# Patient Record
Sex: Male | Born: 1985 | Race: Black or African American | Hispanic: No | Marital: Single | Smoking: Current every day smoker
Health system: Southern US, Community
[De-identification: ages and names within clinical notes are randomized; demographics above are authoritative.]

## PROBLEM LIST (undated history)

## (undated) ENCOUNTER — Emergency Department: Admission: EM | Payer: No Typology Code available for payment source | Source: Home / Self Care

## (undated) DIAGNOSIS — J302 Other seasonal allergic rhinitis: Secondary | ICD-10-CM

## (undated) DIAGNOSIS — Z789 Other specified health status: Secondary | ICD-10-CM

---
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
## 2008-12-12 ENCOUNTER — Emergency Department: Payer: Self-pay | Admitting: Emergency Medicine

## 2013-03-08 ENCOUNTER — Emergency Department: Payer: Self-pay | Admitting: Emergency Medicine

## 2013-04-21 ENCOUNTER — Emergency Department: Payer: Self-pay | Admitting: Emergency Medicine

## 2013-11-25 ENCOUNTER — Emergency Department: Payer: Self-pay | Admitting: Emergency Medicine

## 2013-11-25 LAB — BASIC METABOLIC PANEL
ANION GAP: 2 — AB (ref 7–16)
BUN: 13 mg/dL (ref 7–18)
CO2: 30 mmol/L (ref 21–32)
Calcium, Total: 9.1 mg/dL (ref 8.5–10.1)
Chloride: 105 mmol/L (ref 98–107)
Creatinine: 1.06 mg/dL (ref 0.60–1.30)
EGFR (African American): >60
GLUCOSE: 91 mg/dL (ref 65–99)
Osmolality: 274 (ref 275–301)
Potassium: 3.9 mmol/L (ref 3.5–5.1)
Sodium: 137 mmol/L (ref 136–145)

## 2013-11-25 LAB — CBC
HCT: 44.3 % (ref 40.0–52.0)
HGB: 14.1 g/dL (ref 13.0–18.0)
MCH: 27.7 pg (ref 26.0–34.0)
MCHC: 31.9 g/dL — AB (ref 32.0–36.0)
MCV: 87 fL (ref 80–100)
Platelet: 202 10*3/uL (ref 150–440)
RBC: 5.09 10*6/uL (ref 4.40–5.90)
RDW: 16.6 % — AB (ref 11.5–14.5)
WBC: 7.2 10*3/uL (ref 3.8–10.6)

## 2014-12-23 ENCOUNTER — Emergency Department
Admission: EM | Admit: 2014-12-23 | Discharge: 2014-12-23 | Discharge status: Against Medical Advice | Payer: Self-pay | Attending: Emergency Medicine | Admitting: Emergency Medicine

## 2014-12-23 DIAGNOSIS — R112 Nausea with vomiting, unspecified: Secondary | ICD-10-CM | POA: Insufficient documentation

## 2014-12-23 LAB — COMPREHENSIVE METABOLIC PANEL
ALT: 15 U/L — ABNORMAL LOW (ref 17–63)
AST: 24 U/L (ref 15–41)
Albumin: 4 g/dL (ref 3.5–5.0)
Alkaline Phosphatase: 72 U/L (ref 38–126)
Anion gap: 9 (ref 5–15)
BUN: 10 mg/dL (ref 6–20)
CALCIUM: 8.7 mg/dL — AB (ref 8.9–10.3)
CO2: 26 mmol/L (ref 22–32)
CREATININE: 1.05 mg/dL (ref 0.61–1.24)
Chloride: 103 mmol/L (ref 101–111)
GFR calc Af Amer: >60 mL/min (ref >60)
Glucose, Bld: 87 mg/dL (ref 65–99)
Potassium: 3.5 mmol/L (ref 3.5–5.1)
Sodium: 138 mmol/L (ref 135–145)
Total Bilirubin: 0.3 mg/dL (ref 0.3–1.2)
Total Protein: 6.6 g/dL (ref 6.5–8.1)

## 2014-12-23 LAB — CBC WITH DIFFERENTIAL/PLATELET
BASOS PCT: 1 %
Basophils Absolute: 0.1 10*3/uL (ref 0–0.1)
EOS PCT: 1 %
Eosinophils Absolute: 0.1 10*3/uL (ref 0–0.7)
HEMATOCRIT: 40.5 % (ref 40.0–52.0)
HEMOGLOBIN: 13.1 g/dL (ref 13.0–18.0)
Lymphocytes Relative: 40 %
Lymphs Abs: 3.9 10*3/uL — ABNORMAL HIGH (ref 1.0–3.6)
MCH: 28 pg (ref 26.0–34.0)
MCHC: 32.4 g/dL (ref 32.0–36.0)
MCV: 86.5 fL (ref 80.0–100.0)
MONOS PCT: 6 %
Monocytes Absolute: 0.6 10*3/uL (ref 0.2–1.0)
NEUTROS ABS: 5.1 10*3/uL (ref 1.4–6.5)
Neutrophils Relative %: 52 %
Platelets: 189 10*3/uL (ref 150–440)
RBC: 4.68 MIL/uL (ref 4.40–5.90)
RDW: 16 % — ABNORMAL HIGH (ref 11.5–14.5)
WBC: 9.7 10*3/uL (ref 3.8–10.6)

## 2014-12-23 MED ORDER — ONDANSETRON 4 MG PO TBDP
4.0000 mg | ORAL_TABLET | Freq: Once | ORAL | Status: AC
  Administered 2014-12-23: 4 mg via ORAL

## 2014-12-23 MED ORDER — ONDANSETRON 4 MG PO TBDP
ORAL_TABLET | ORAL | Status: AC
  Filled 2014-12-23: qty 1

## 2014-12-23 NOTE — ED Notes (Signed)
Pt reports getting sick last night at work, pt states that he has vomited 8 times with the last time in triage. Pt denies abd pain but states has constant nausea

## 2015-02-14 ENCOUNTER — Emergency Department
Admission: EM | Admit: 2015-02-14 | Discharge: 2015-02-14 | Disposition: A | Discharge status: Home / Self Care | Payer: Self-pay | Attending: Emergency Medicine | Admitting: Emergency Medicine

## 2015-02-14 ENCOUNTER — Encounter: Payer: Self-pay | Admitting: Emergency Medicine

## 2015-02-14 DIAGNOSIS — Z720 Tobacco use: Secondary | ICD-10-CM | POA: Insufficient documentation

## 2015-02-14 DIAGNOSIS — R519 Headache, unspecified: Secondary | ICD-10-CM

## 2015-02-14 DIAGNOSIS — R1110 Vomiting, unspecified: Secondary | ICD-10-CM | POA: Insufficient documentation

## 2015-02-14 DIAGNOSIS — R51 Headache: Secondary | ICD-10-CM | POA: Insufficient documentation

## 2015-02-14 MED ORDER — SODIUM CHLORIDE 0.9 % IV BOLUS (SEPSIS)
1000.0000 mL | Freq: Once | INTRAVENOUS | Status: AC
  Administered 2015-02-14: 1000 mL via INTRAVENOUS

## 2015-02-14 MED ORDER — PROCHLORPERAZINE EDISYLATE 5 MG/ML IJ SOLN
10.0000 mg | Freq: Four times a day (QID) | INTRAMUSCULAR | Status: DC | PRN
  Administered 2015-02-14: 10 mg via INTRAVENOUS
  Filled 2015-02-14: qty 2

## 2015-02-14 NOTE — ED Notes (Signed)
States he developed right side headache last pm  Had some n/v last night  Took OTC meds for pain with out relief

## 2015-02-14 NOTE — Discharge Instructions (Signed)
Please seek medical attention for any high fevers, chest pain, shortness of breath, change in behavior, persistent vomiting, bloody stool or any other new or concerning symptoms. ° °Headaches, Frequently Asked Questions °MIGRAINE HEADACHES °Q: What is migraine? What causes it? How can I treat it? °A: Generally, migraine headaches begin as a dull ache. Then they develop into a constant, throbbing, and pulsating pain. You may experience pain at the temples. You may experience pain at the front or back of one or both sides of the head. The pain is usually accompanied by a combination of: °· Nausea. °· Vomiting. °· Sensitivity to light and noise. °Some people (about 15%) experience an aura (see below) before an attack. The cause of migraine is believed to be chemical reactions in the brain. Treatment for migraine may include over-the-counter or prescription medications. It may also include self-help techniques. These include relaxation training and biofeedback.  °Q: What is an aura? °A: About 15% of people with migraine get an "aura". This is a sign of neurological symptoms that occur before a migraine headache. You may see wavy or jagged lines, dots, or flashing lights. You might experience tunnel vision or blind spots in one or both eyes. The aura can include visual or auditory hallucinations (something imagined). It may include disruptions in smell (such as strange odors), taste or touch. Other symptoms include: °· Numbness. °· A "pins and needles" sensation. °· Difficulty in recalling or speaking the correct word. °These neurological events may last as long as 60 minutes. These symptoms will fade as the headache begins. °Q: What is a trigger? °A: Certain physical or environmental factors can lead to or "trigger" a migraine. These include: °· Foods. °· Hormonal changes. °· Weather. °· Stress. °It is important to remember that triggers are different for everyone. To help prevent migraine attacks, you need to figure  out which triggers affect you. Keep a headache diary. This is a good way to track triggers. The diary will help you talk to your healthcare professional about your condition. °Q: Does weather affect migraines? °A: Bright sunshine, hot, humid conditions, and drastic changes in barometric pressure may lead to, or "trigger," a migraine attack in some people. But studies have shown that weather does not act as a trigger for everyone with migraines. °Q: What is the link between migraine and hormones? °A: Hormones start and regulate many of your body's functions. Hormones keep your body in balance within a constantly changing environment. The levels of hormones in your body are unbalanced at times. Examples are during menstruation, pregnancy, or menopause. That can lead to a migraine attack. In fact, about three quarters of all women with migraine report that their attacks are related to the menstrual cycle.  °Q: Is there an increased risk of stroke for migraine sufferers? °A: The likelihood of a migraine attack causing a stroke is very remote. That is not to say that migraine sufferers cannot have a stroke associated with their migraines. In persons under age 40, the most common associated factor for stroke is migraine headache. But over the course of a person's normal life span, the occurrence of migraine headache may actually be associated with a reduced risk of dying from cerebrovascular disease due to stroke.  °Q: What are acute medications for migraine? °A: Acute medications are used to treat the pain of the headache after it has started. Examples over-the-counter medications, NSAIDs, ergots, and triptans.  °Q: What are the triptans? °A: Triptans are the newest class of abortive medications.   They are specifically targeted to treat migraine. Triptans are vasoconstrictors. They moderate some chemical reactions in the brain. The triptans work on receptors in your brain. Triptans help to restore the balance of a  neurotransmitter called serotonin. Fluctuations in levels of serotonin are thought to be a main cause of migraine.  °Q: Are over-the-counter medications for migraine effective? °A: Over-the-counter, or "OTC," medications may be effective in relieving mild to moderate pain and associated symptoms of migraine. But you should see your caregiver before beginning any treatment regimen for migraine.  °Q: What are preventive medications for migraine? °A: Preventive medications for migraine are sometimes referred to as "prophylactic" treatments. They are used to reduce the frequency, severity, and length of migraine attacks. Examples of preventive medications include antiepileptic medications, antidepressants, beta-blockers, calcium channel blockers, and NSAIDs (nonsteroidal anti-inflammatory drugs). °Q: Why are anticonvulsants used to treat migraine? °A: During the past few years, there has been an increased interest in antiepileptic drugs for the prevention of migraine. They are sometimes referred to as "anticonvulsants". Both epilepsy and migraine may be caused by similar reactions in the brain.  °Q: Why are antidepressants used to treat migraine? °A: Antidepressants are typically used to treat people with depression. They may reduce migraine frequency by regulating chemical levels, such as serotonin, in the brain.  °Q: What alternative therapies are used to treat migraine? °A: The term "alternative therapies" is often used to describe treatments considered outside the scope of conventional Western medicine. Examples of alternative therapy include acupuncture, acupressure, and yoga. Another common alternative treatment is herbal therapy. Some herbs are believed to relieve headache pain. Always discuss alternative therapies with your caregiver before proceeding. Some herbal products contain arsenic and other toxins. °TENSION HEADACHES °Q: What is a tension-type headache? What causes it? How can I treat it? °A:  Tension-type headaches occur randomly. They are often the result of temporary stress, anxiety, fatigue, or anger. Symptoms include soreness in your temples, a tightening band-like sensation around your head (a "vice-like" ache). Symptoms can also include a pulling feeling, pressure sensations, and contracting head and neck muscles. The headache begins in your forehead, temples, or the back of your head and neck. Treatment for tension-type headache may include over-the-counter or prescription medications. Treatment may also include self-help techniques such as relaxation training and biofeedback. °CLUSTER HEADACHES °Q: What is a cluster headache? What causes it? How can I treat it? °A: Cluster headache gets its name because the attacks come in groups. The pain arrives with little, if any, warning. It is usually on one side of the head. A tearing or bloodshot eye and a runny nose on the same side of the headache may also accompany the pain. Cluster headaches are believed to be caused by chemical reactions in the brain. They have been described as the most severe and intense of any headache type. Treatment for cluster headache includes prescription medication and oxygen. °SINUS HEADACHES °Q: What is a sinus headache? What causes it? How can I treat it? °A: When a cavity in the bones of the face and skull (a sinus) becomes inflamed, the inflammation will cause localized pain. This condition is usually the result of an allergic reaction, a tumor, or an infection. If your headache is caused by a sinus blockage, such as an infection, you will probably have a fever. An x-ray will confirm a sinus blockage. Your caregiver's treatment might include antibiotics for the infection, as well as antihistamines or decongestants.  °REBOUND HEADACHES °Q: What is a rebound   headache? What causes it? How can I treat it? °A: A pattern of taking acute headache medications too often can lead to a condition known as "rebound headache." A  pattern of taking too much headache medication includes taking it more than 2 days per week or in excessive amounts. That means more than the label or a caregiver advises. With rebound headaches, your medications not only stop relieving pain, they actually begin to cause headaches. Doctors treat rebound headache by tapering the medication that is being overused. Sometimes your caregiver will gradually substitute a different type of treatment or medication. Stopping may be a challenge. Regularly overusing a medication increases the potential for serious side effects. Consult a caregiver if you regularly use headache medications more than 2 days per week or more than the label advises. °ADDITIONAL QUESTIONS AND ANSWERS °Q: What is biofeedback? °A: Biofeedback is a self-help treatment. Biofeedback uses special equipment to monitor your body's involuntary physical responses. Biofeedback monitors: °· Breathing. °· Pulse. °· Heart rate. °· Temperature. °· Muscle tension. °· Brain activity. °Biofeedback helps you refine and perfect your relaxation exercises. You learn to control the physical responses that are related to stress. Once the technique has been mastered, you do not need the equipment any more. °Q: Are headaches hereditary? °A: Four out of five (80%) of people that suffer report a family history of migraine. Scientists are not sure if this is genetic or a family predisposition. Despite the uncertainty, a child has a 50% chance of having migraine if one parent suffers. The child has a 75% chance if both parents suffer.  °Q: Can children get headaches? °A: By the time they reach high school, most young people have experienced some type of headache. Many safe and effective approaches or medications can prevent a headache from occurring or stop it after it has begun.  °Q: What type of doctor should I see to diagnose and treat my headache? °A: Start with your primary caregiver. Discuss his or her experience and  approach to headaches. Discuss methods of classification, diagnosis, and treatment. Your caregiver may decide to recommend you to a headache specialist, depending upon your symptoms or other physical conditions. Having diabetes, allergies, etc., may require a more comprehensive and inclusive approach to your headache. The [employer] will provide, upon request, a list of NHF physician members in your state. °Document Released: 08/31/2003 Document Revised: 09/02/2011 Document Reviewed: 02/08/2008 °ExitCare® Patient Information ©2015 [employer]. This information is not intended to replace advice given to you by your health care provider. Make sure you discuss any questions you have with your health care provider. ° °

## 2015-02-14 NOTE — ED Provider Notes (Signed)
[facility] Emergency Department Provider Note    ____________________________________________  Time seen: 46  I have reviewed the triage vital signs and the nursing notes.   HISTORY  Chief Complaint Headache   History limited by: Not Limited   HPI Harold Oneill is a 28 y.o. male who presents to the emergency department today because of concerns for headache. He states that it started last night. States it is been constant and severe since then. It is located primarily in the right side. He has noticed some sensitivity to light and sound. He states that he has had headaches in the past but this one was not getting any better. He states he did have one episode of emesis yesterday. Denies any recent trauma to his head. He tried taking BC powder yesterday without any relief. Denies any fevers.   History reviewed. No pertinent past medical history.  There are no active problems to display for this patient.   History reviewed. No pertinent past surgical history.  No current outpatient prescriptions on file.  Allergies Review of patient's allergies indicates no known allergies.  No family history on file.  Social History Social History  Substance Use Topics  . Smoking status: Current Every Day Smoker  . Smokeless tobacco: None  . Alcohol Use: No    Review of Systems  Constitutional: Negative for fever. Cardiovascular: Negative for chest pain. Respiratory: Negative for shortness of breath. Gastrointestinal: Positive for vomiting Genitourinary: Negative for dysuria. Musculoskeletal: Negative for back pain. Skin: Negative for rash. Neurological: Positive for headache   10-point ROS otherwise negative.  ____________________________________________   PHYSICAL EXAM:  VITAL SIGNS: ED Triage Vitals  Enc Vitals Group     BP 02/14/15 1024 119/82 mmHg     Pulse Rate 02/14/15 1024 61     Resp --      Temp 02/14/15 1024 98.2 F (36.8 C)      Temp Source 02/14/15 1024 Oral     SpO2 02/14/15 1024 100 %     Weight 02/14/15 1033 205 lb (92.987 kg)     Height 02/14/15 1033  (1.905 m)     Head Cir --      Peak Flow --      Pain Score 02/14/15 1034 10   Constitutional: Alert and oriented. Well appearing and in no distress. Eyes: Conjunctivae are normal. PERRL. Normal extraocular movements. Photophobia present. No injection.  ENT   Head: Normocephalic and atraumatic.   Nose: No congestion/rhinnorhea.   Mouth/Throat: Mucous membranes are moist.   Neck: No stridor. Hematological/Lymphatic/Immunilogical: No cervical lymphadenopathy. Cardiovascular: Normal rate, regular rhythm.  No murmurs, rubs, or gallops. Respiratory: Normal respiratory effort without tachypnea nor retractions. Breath sounds are clear and equal bilaterally. No wheezes/rales/rhonchi. Gastrointestinal: Soft and nontender. No distention. Genitourinary: Deferred Musculoskeletal: Normal range of motion in all extremities. No joint effusions.  No lower extremity tenderness nor edema. Neurologic:  Normal speech and language. No gross focal neurologic deficits are appreciated. Speech is normal.  Skin:  Skin is warm, dry and intact. No rash noted. Psychiatric: Mood and affect are normal. Speech and behavior are normal. Patient exhibits appropriate insight and judgment.  ____________________________________________    LABS (pertinent positives/negatives)  None  ____________________________________________   EKG  None  ____________________________________________    RADIOLOGY  None  ____________________________________________   PROCEDURES  Procedure(s) performed: None  Critical Care performed: No  ____________________________________________   INITIAL IMPRESSION / ASSESSMENT AND PLAN / ED COURSE  Pertinent labs & imaging  results that were available during my care of the patient were reviewed by me and considered in my  medical decision making (see chart for details).  Patient presented to the emergency department today because of headache since yesterday. On exam no focal neuro deficits. Patient was given Compazine and started on IV fluids. The patient stated he felt better after the IV Compazine and pulled out his IV fluids. He states he felt better and wanted to go home.  ____________________________________________   FINAL CLINICAL IMPRESSION(S) / ED DIAGNOSES  Final diagnoses:  Headache, unspecified headache type     Phineas Semen, MD 02/14/15 1207

## 2015-02-14 NOTE — ED Notes (Signed)
Right sided headaches since last pm had some vomiting last pm

## 2017-02-28 ENCOUNTER — Emergency Department
Admission: EM | Admit: 2017-02-28 | Discharge: 2017-02-28 | Disposition: A | Discharge status: Home / Self Care | Payer: Self-pay | Attending: Emergency Medicine | Admitting: Emergency Medicine

## 2017-02-28 DIAGNOSIS — R030 Elevated blood-pressure reading, without diagnosis of hypertension: Secondary | ICD-10-CM | POA: Insufficient documentation

## 2017-02-28 DIAGNOSIS — Z5321 Procedure and treatment not carried out due to patient leaving prior to being seen by health care provider: Secondary | ICD-10-CM | POA: Insufficient documentation

## 2017-02-28 NOTE — ED Triage Notes (Signed)
Pt states he was at work and was feeling anxious and someone at work told him that it could be his blood pressure.  Pt states he was just here to get that checked, but needs a work note to go back to work since he left.  Pt is A&Ox4 and in NAD at this time.

## 2017-03-01 ENCOUNTER — Emergency Department
Admission: EM | Admit: 2017-03-01 | Discharge: 2017-03-01 | Disposition: A | Discharge status: Home / Self Care | Payer: Self-pay | Attending: Emergency Medicine | Admitting: Emergency Medicine

## 2017-03-01 ENCOUNTER — Emergency Department: Payer: Self-pay

## 2017-03-01 DIAGNOSIS — Y999 Unspecified external cause status: Secondary | ICD-10-CM | POA: Insufficient documentation

## 2017-03-01 DIAGNOSIS — W260XXA Contact with knife, initial encounter: Secondary | ICD-10-CM | POA: Insufficient documentation

## 2017-03-01 DIAGNOSIS — Y92009 Unspecified place in unspecified non-institutional (private) residence as the place of occurrence of the external cause: Secondary | ICD-10-CM | POA: Insufficient documentation

## 2017-03-01 DIAGNOSIS — F17210 Nicotine dependence, cigarettes, uncomplicated: Secondary | ICD-10-CM | POA: Insufficient documentation

## 2017-03-01 DIAGNOSIS — S66822A Laceration of other specified muscles, fascia and tendons at wrist and hand level, left hand, initial encounter: Secondary | ICD-10-CM | POA: Insufficient documentation

## 2017-03-01 DIAGNOSIS — Y93G1 Activity, food preparation and clean up: Secondary | ICD-10-CM | POA: Insufficient documentation

## 2017-03-01 DIAGNOSIS — S66802A Unspecified injury of other specified muscles, fascia and tendons at wrist and hand level, left hand, initial encounter: Secondary | ICD-10-CM | POA: Insufficient documentation

## 2017-03-01 MED ORDER — HYDROCODONE-ACETAMINOPHEN 5-325 MG PO TABS: 1.0000 | ORAL_TABLET | Freq: Four times a day (QID) | ORAL | 0 refills | Status: DC | PRN

## 2017-03-01 MED ORDER — LIDOCAINE HCL (PF) 1 % IJ SOLN
2.0000 mL | Freq: Once | INTRAMUSCULAR | Status: AC
  Administered 2017-03-01: 2 mL
  Filled 2017-03-01: qty 5

## 2017-03-01 MED ORDER — OXYCODONE-ACETAMINOPHEN 5-325 MG PO TABS
1.0000 | ORAL_TABLET | Freq: Once | ORAL | Status: AC
  Administered 2017-03-01: 1 via ORAL
  Filled 2017-03-01: qty 1

## 2017-03-01 MED ORDER — AMOXICILLIN-POT CLAVULANATE 875-125 MG PO TABS: 1.0000 | ORAL_TABLET | Freq: Two times a day (BID) | ORAL | 0 refills | Status: AC

## 2017-03-01 MED ORDER — CEFAZOLIN SODIUM-DEXTROSE 1-4 GM/50ML-% IV SOLN
1.0000 g | Freq: Once | INTRAVENOUS | Status: AC
  Administered 2017-03-01: 1 g via INTRAVENOUS
  Filled 2017-03-01: qty 50

## 2017-03-01 NOTE — ED Triage Notes (Signed)
Pt came to ED via pov. Laceration to left hand, 2,3,4th digits from knife (pt was cooking steak) Pain 10/10.

## 2017-03-01 NOTE — ED Provider Notes (Signed)
[facility]-EMERGENCY DEPARTMENT Provider Note   CSN: [id number] Arrival date & time: 03/01/17  1653     History   Chief Complaint Chief Complaint  Patient presents with  . Laceration    HPI Harold Oneill is a 31 y.o. male presents to the emergency department for evaluation of laceration to the volar aspect of his left second third fourth and fifth digits. patient states just prior to arrival he was at home cutting meat with a steak knife when he sliced his left hand. Patient's pain is moderate. Bleeding is well controlled. Tetanus is up-to-date. He has lacerations to the second third fourth and fifth digits.he feels numbness along the second third fourth and fifth digits. HPI  History reviewed. No pertinent past medical history.  There are no active problems to display for this patient.   History reviewed. No pertinent surgical history.     Home Medications    Prior to Admission medications   Medication Sig Start Date End Date Taking? Authorizing Provider  amoxicillin-clavulanate (AUGMENTIN) 875-125 MG tablet Take 1 tablet by mouth every 12 (twelve) hours. 03/01/17 03/11/17  Evon Slack, PA-C  HYDROcodone-acetaminophen (NORCO) 5-325 MG tablet Take 1 tablet by mouth every 6 (six) hours as needed for moderate pain. 03/01/17   Evon Slack, PA-C    Family History No family history on file.  Social History Social History  Substance Use Topics  . Smoking status: Current Every Day Smoker  . Smokeless tobacco: Not on file  . Alcohol use No     Allergies   Patient has no known allergies.   Review of Systems Review of Systems  Constitutional: Negative for activity change and appetite change.  Respiratory: Negative for shortness of breath.   Cardiovascular: Negative for chest pain.  Gastrointestinal: Negative for nausea.  Musculoskeletal: Negative for arthralgias.  Skin: Negative for color change.  Neurological: Positive for weakness. Negative for dizziness.    Hematological: Negative for adenopathy.  Psychiatric/Behavioral: Negative for agitation and behavioral problems.     Physical Exam Updated Vital Signs BP (!) 160/108   Pulse 82   Temp 97.9 F (36.6 C) (Oral)   Resp 16   Ht  (1.905 m)   Wt 90.7 kg (200 lb)   SpO2 100%   BMI 25.00 kg/m   Physical Exam  Constitutional: He is oriented to person, place, and time. He appears well-developed and well-nourished.  HENT:  Head: Normocephalic and atraumatic.  Eyes: Conjunctivae are normal.  Neck: Normal range of motion.  Cardiovascular: Normal rate.   Pulmonary/Chest: Effort normal. No respiratory distress.  Musculoskeletal: Normal range of motion.  Examination of the left hand shows patient has lacerations along the volar aspect of the middle phalanx of the second third fourth and fifth digits. Patient is unable to flex the DIP joints of the second and third fourth and fifth digits. Sensation is intact distally but slightly decreased on all 4 digits. Cap refill is less than 2 seconds in all 5 digits.  Neurological: He is alert and oriented to person, place, and time.  Skin: Skin is warm. No rash noted.  Psychiatric: He has a normal mood and affect. His behavior is normal. Thought content normal.     ED Treatments / Results  Labs (all labs ordered are listed, but only abnormal results are displayed) Labs Reviewed - No data to display  EKG  EKG Interpretation None       Radiology Dg Hand Complete Left  Result Date: 03/01/2017 CLINICAL  DATA:  Second, third, and fourth digit laceration along the middle phalanx. Laceration from knife. EXAM: LEFT HAND - COMPLETE 3+ VIEW COMPARISON:  None. FINDINGS: Linear soft tissue defect about the volar second, third, and fourth digit middle phalanx consistent with laceration. No radiopaque foreign body. There is no evidence of acute fracture or dislocation. Incidental osseous lunotriquetral coalition. Remote fifth metacarpal fracture. There  is no evidence of arthropathy or other focal bone abnormality. IMPRESSION: Laceration about the second, third, and fourth digit without radiopaque foreign body or acute osseous abnormality. Electronically Signed   By: Rubye Oaks M.D.   On: 03/01/2017 18:35    Procedures Procedures (including critical care time) LACERATION REPAIR Performed by: Patience Musca Authorized by: Patience Musca Consent: Verbal consent obtained. Risks and benefits: risks, benefits and alternatives were discussed Consent given by: patient Patient identity confirmed: provided demographic data Prepped and Draped in normal sterile fashion Wound explored  Laceration Location: volar aspect of the left second third fourth and fifth digits  Laceration Length: 8 cm  No Foreign Bodies seen or palpated  Anesthesia: local infiltration  Local anesthetic: lidocaine 1%  Anesthetic total: 5 ml  Irrigation method: syringe Amount of cleaning: standard  Skin closure: simple interrupted  Number of sutures: 5 on the index finger, 6 on the middle finger, 3 on the ring finger, one on the fifth digit  Technique: simple interrupted Patient tolerance: Patient tolerated the procedure well with no immediate complications.   SPLINT APPLICATION Date/Time: 8:58 PM Authorized by: Patience Musca Consent: Verbal consent obtained. Risks and benefits: risks, benefits and alternatives were discussed Consent given by: patient Splint applied by: ED physician assistant Location details: dorsal aspect of the left hand Splint type: Ortho-Glass, three-inch Supplies used: ace wrap,  Ortho-Glass prewrap Post-procedure: The splinted body part was neurovascularly unchanged following the procedure. Patient tolerance: Patient tolerated the procedure well with no immediate complications.     Medications Ordered in ED Medications  ceFAZolin (ANCEF) IVPB 1 g/50 mL premix (1 g Intravenous New  Bag/Given 03/01/17 1806)  oxyCODONE-acetaminophen (PERCOCET/ROXICET) 5-325 MG per tablet 1 tablet (1 tablet Oral Given 03/01/17 2007)  lidocaine (PF) (XYLOCAINE) 1 % injection 2 mL (2 mLs Infiltration Given 03/01/17 2008)     Initial Impression / Assessment and Plan / ED Course  I have reviewed the triage vital signs and the nursing notes.  Pertinent labs & imaging results that were available during my care of the patient were reviewed by me and considered in my medical decision making (see chart for details).     32 year old male with lacerations to the left second, third, fourth and fifth digits.Physical exam consistent with flexor tendon injuries to the second third and fourth digits. Patient appears to have chronic deformity to the fifth digit. Tetanus is up-to-date. Patient given 1 g of Ancef IV Pain well controlled. Good perfusion throughout the digits. Discussed with orthopedist on call, he recommended hand surgeon. Hand surgeon on call and Ginette Otto was called and agrees to see patient in clinic first of next week. Patient will call the office first thing Monday morning.  Final Clinical Impressions(s) / ED Diagnoses   Final diagnoses:  Injury of flexor tendon of hand, left, initial encounter  Laceration of flexor tendon of left hand, initial encounter    New Prescriptions New Prescriptions   AMOXICILLIN-CLAVULANATE (AUGMENTIN) 875-125 MG TABLET    Take 1 tablet by mouth every 12 (twelve) hours.   HYDROCODONE-ACETAMINOPHEN (NORCO) 5-325 MG TABLET  Take 1 tablet by mouth every 6 (six) hours as needed for moderate pain.     Evon SlackGaines, Kodee Ravert C, PA-C 03/01/17 2058    Jeanmarie PlantMcShane, James A, MD 03/02/17 [phone]

## 2017-03-01 NOTE — ED Notes (Addendum)
Pt smells strong of alcohol. Family at bedside.

## 2017-03-01 NOTE — Discharge Instructions (Signed)
Please keep splint clean and dry. Do not remove splint. Return to the ER for any increasing pain, worsening symptoms or changes in her health. Call Dr. Ronie SpiesWeingold's office first thing Monday morning to schedule follow-up appointment. You will need to have surgery next week.

## 2017-03-01 NOTE — ED Notes (Signed)
Pt stumbled when walking to the room.  Pt also admits to ETOH use today.

## 2017-03-04 ENCOUNTER — Encounter (HOSPITAL_BASED_OUTPATIENT_CLINIC_OR_DEPARTMENT_OTHER): Payer: Self-pay | Admitting: *Deleted

## 2017-03-04 ENCOUNTER — Other Ambulatory Visit: Payer: Self-pay | Admitting: Orthopedic Surgery

## 2017-03-05 ENCOUNTER — Encounter (HOSPITAL_BASED_OUTPATIENT_CLINIC_OR_DEPARTMENT_OTHER)
Admission: RE | Disposition: A | Discharge status: Home / Self Care | Payer: Self-pay | Source: Ambulatory Visit | Attending: Orthopedic Surgery

## 2017-03-05 ENCOUNTER — Ambulatory Visit (HOSPITAL_BASED_OUTPATIENT_CLINIC_OR_DEPARTMENT_OTHER)
Admission: RE | Admit: 2017-03-05 | Discharge: 2017-03-05 | Disposition: A | Discharge status: Home / Self Care | Payer: Self-pay | Source: Ambulatory Visit | Attending: Orthopedic Surgery | Admitting: Orthopedic Surgery

## 2017-03-05 ENCOUNTER — Encounter (HOSPITAL_BASED_OUTPATIENT_CLINIC_OR_DEPARTMENT_OTHER): Payer: Self-pay | Admitting: Emergency Medicine

## 2017-03-05 ENCOUNTER — Ambulatory Visit (HOSPITAL_BASED_OUTPATIENT_CLINIC_OR_DEPARTMENT_OTHER): Payer: Self-pay | Admitting: Certified Registered"

## 2017-03-05 DIAGNOSIS — S64493A Injury of digital nerve of left middle finger, initial encounter: Secondary | ICD-10-CM | POA: Insufficient documentation

## 2017-03-05 DIAGNOSIS — S64491A Injury of digital nerve of left index finger, initial encounter: Secondary | ICD-10-CM | POA: Insufficient documentation

## 2017-03-05 DIAGNOSIS — S61211A Laceration without foreign body of left index finger without damage to nail, initial encounter: Secondary | ICD-10-CM | POA: Insufficient documentation

## 2017-03-05 DIAGNOSIS — S61213A Laceration without foreign body of left middle finger without damage to nail, initial encounter: Secondary | ICD-10-CM | POA: Insufficient documentation

## 2017-03-05 DIAGNOSIS — F17210 Nicotine dependence, cigarettes, uncomplicated: Secondary | ICD-10-CM | POA: Insufficient documentation

## 2017-03-05 DIAGNOSIS — S65012A Laceration of ulnar artery at wrist and hand level of left arm, initial encounter: Secondary | ICD-10-CM | POA: Insufficient documentation

## 2017-03-05 DIAGNOSIS — W458XXA Other foreign body or object entering through skin, initial encounter: Secondary | ICD-10-CM | POA: Insufficient documentation

## 2017-03-05 DIAGNOSIS — S61215A Laceration without foreign body of left ring finger without damage to nail, initial encounter: Secondary | ICD-10-CM | POA: Insufficient documentation

## 2017-03-05 DIAGNOSIS — S64495A Injury of digital nerve of left ring finger, initial encounter: Secondary | ICD-10-CM | POA: Insufficient documentation

## 2017-03-05 HISTORY — DX: Other seasonal allergic rhinitis: J30.2

## 2017-03-05 HISTORY — PX: NERVE, TENDON AND ARTERY REPAIR: SHX5695

## 2017-03-05 SURGERY — NERVE, TENDON AND ARTERY REPAIR
Anesthesia: General | Site: Hand | Laterality: Left

## 2017-03-05 MED ORDER — HYDRALAZINE HCL 20 MG/ML IJ SOLN
INTRAMUSCULAR | Status: AC
  Filled 2017-03-05: qty 1

## 2017-03-05 MED ORDER — FENTANYL CITRATE (PF) 100 MCG/2ML IJ SOLN
INTRAMUSCULAR | Status: AC
  Filled 2017-03-05: qty 2

## 2017-03-05 MED ORDER — PROMETHAZINE HCL 25 MG/ML IJ SOLN: 6.2500 mg | INTRAMUSCULAR | Status: DC | PRN

## 2017-03-05 MED ORDER — MIDAZOLAM HCL 2 MG/2ML IJ SOLN
1.0000 mg | INTRAMUSCULAR | Status: DC | PRN
  Administered 2017-03-05: 2 mg via INTRAVENOUS

## 2017-03-05 MED ORDER — OXYCODONE HCL 5 MG PO TABS: 5.0000 mg | ORAL_TABLET | Freq: Once | ORAL | Status: DC | PRN

## 2017-03-05 MED ORDER — KETOROLAC TROMETHAMINE 30 MG/ML IJ SOLN
INTRAMUSCULAR | Status: DC | PRN
  Administered 2017-03-05: 30 mg via INTRAVENOUS

## 2017-03-05 MED ORDER — CEFAZOLIN SODIUM-DEXTROSE 2-4 GM/100ML-% IV SOLN
2.0000 g | INTRAVENOUS | Status: AC
  Administered 2017-03-05: 2 g via INTRAVENOUS

## 2017-03-05 MED ORDER — OXYCODONE-ACETAMINOPHEN 5-325 MG PO TABS: 1.0000 | ORAL_TABLET | Freq: Four times a day (QID) | ORAL | 0 refills | Status: DC | PRN

## 2017-03-05 MED ORDER — LIDOCAINE 2% (20 MG/ML) 5 ML SYRINGE
INTRAMUSCULAR | Status: DC | PRN
  Administered 2017-03-05: 100 mg via INTRAVENOUS

## 2017-03-05 MED ORDER — HYDROMORPHONE HCL 1 MG/ML IJ SOLN
0.2500 mg | INTRAMUSCULAR | Status: DC | PRN
  Administered 2017-03-05: 0.5 mg via INTRAVENOUS

## 2017-03-05 MED ORDER — DEXAMETHASONE SODIUM PHOSPHATE 10 MG/ML IJ SOLN
INTRAMUSCULAR | Status: DC | PRN
  Administered 2017-03-05: 10 mg via INTRAVENOUS

## 2017-03-05 MED ORDER — BUPIVACAINE HCL (PF) 0.25 % IJ SOLN
INTRAMUSCULAR | Status: DC | PRN
  Administered 2017-03-05: 10 mL

## 2017-03-05 MED ORDER — ONDANSETRON HCL 4 MG/2ML IJ SOLN
INTRAMUSCULAR | Status: DC | PRN
  Administered 2017-03-05: 4 mg via INTRAVENOUS

## 2017-03-05 MED ORDER — PROPOFOL 10 MG/ML IV BOLUS
INTRAVENOUS | Status: DC | PRN
  Administered 2017-03-05: 230 mg via INTRAVENOUS

## 2017-03-05 MED ORDER — MIDAZOLAM HCL 2 MG/2ML IJ SOLN
INTRAMUSCULAR | Status: AC
  Filled 2017-03-05: qty 2

## 2017-03-05 MED ORDER — OXYCODONE HCL 5 MG/5ML PO SOLN: 5.0000 mg | Freq: Once | ORAL | Status: DC | PRN

## 2017-03-05 MED ORDER — FENTANYL CITRATE (PF) 100 MCG/2ML IJ SOLN
50.0000 ug | INTRAMUSCULAR | Status: AC | PRN
  Administered 2017-03-05: 100 ug via INTRAVENOUS
  Administered 2017-03-05: 50 ug via INTRAVENOUS
  Administered 2017-03-05 (×2): 25 ug via INTRAVENOUS

## 2017-03-05 MED ORDER — SCOPOLAMINE 1 MG/3DAYS TD PT72: 1.0000 | MEDICATED_PATCH | Freq: Once | TRANSDERMAL | Status: DC | PRN

## 2017-03-05 MED ORDER — CEFAZOLIN SODIUM-DEXTROSE 2-4 GM/100ML-% IV SOLN
INTRAVENOUS | Status: AC
  Filled 2017-03-05: qty 100

## 2017-03-05 MED ORDER — LACTATED RINGERS IV SOLN
INTRAVENOUS | Status: DC
  Administered 2017-03-05 (×2): via INTRAVENOUS

## 2017-03-05 MED ORDER — CHLORHEXIDINE GLUCONATE 4 % EX LIQD: 60.0000 mL | Freq: Once | CUTANEOUS | Status: DC

## 2017-03-05 MED ORDER — HYDROMORPHONE HCL 1 MG/ML IJ SOLN
INTRAMUSCULAR | Status: AC
  Filled 2017-03-05: qty 0.5

## 2017-03-05 SURGICAL SUPPLY — 68 items
APL SKNCLS STERI-STRIP NONHPOA (GAUZE/BANDAGES/DRESSINGS)
BAG DECANTER FOR FLEXI CONT (MISCELLANEOUS) IMPLANT
BANDAGE ACE 4X5 VEL STRL LF (GAUZE/BANDAGES/DRESSINGS) IMPLANT
BENZOIN TINCTURE PRP APPL 2/3 (GAUZE/BANDAGES/DRESSINGS) IMPLANT
BLADE MINI RND TIP GREEN BEAV (BLADE) IMPLANT
BLADE SURG 15 STRL LF DISP TIS (BLADE) ×1 IMPLANT
BLADE SURG 15 STRL SS (BLADE) ×3
BNDG CMPR 9X4 STRL LF SNTH (GAUZE/BANDAGES/DRESSINGS) ×1
BNDG COHESIVE 1X5 TAN STRL LF (GAUZE/BANDAGES/DRESSINGS) IMPLANT
BNDG ELASTIC 2X5.8 VLCR STR LF (GAUZE/BANDAGES/DRESSINGS) ×3 IMPLANT
BNDG ESMARK 4X9 LF (GAUZE/BANDAGES/DRESSINGS) ×3 IMPLANT
BNDG GAUZE ELAST 4 BULKY (GAUZE/BANDAGES/DRESSINGS) IMPLANT
CLOSURE WOUND 1/2 X4 (GAUZE/BANDAGES/DRESSINGS)
CORD BIPOLAR FORCEPS 12FT (ELECTRODE) ×3 IMPLANT
COVER BACK TABLE 60X90IN (DRAPES) ×3 IMPLANT
CUFF TOURNIQUET SINGLE 18IN (TOURNIQUET CUFF) ×3 IMPLANT
DECANTER SPIKE VIAL GLASS SM (MISCELLANEOUS) IMPLANT
DRAPE EXTREMITY T 121X128X90 (DRAPE) ×3 IMPLANT
DRAPE SURG 17X23 STRL (DRAPES) ×3 IMPLANT
DURAPREP 26ML APPLICATOR (WOUND CARE) ×3 IMPLANT
GAUZE SPONGE 4X4 12PLY STRL (GAUZE/BANDAGES/DRESSINGS) ×3 IMPLANT
GAUZE SPONGE 4X4 16PLY XRAY LF (GAUZE/BANDAGES/DRESSINGS) IMPLANT
GAUZE XEROFORM 1X8 LF (GAUZE/BANDAGES/DRESSINGS) IMPLANT
GLOVE BIO SURGEON STRL SZ 6.5 (GLOVE) ×2 IMPLANT
GLOVE BIO SURGEONS STRL SZ 6.5 (GLOVE) ×1
GLOVE BIOGEL M STRL SZ7.5 (GLOVE) IMPLANT
GLOVE BIOGEL PI IND STRL 7.0 (GLOVE) ×2 IMPLANT
GLOVE BIOGEL PI INDICATOR 7.0 (GLOVE) ×4
GLOVE SURG SYN 8.0 (GLOVE) ×6 IMPLANT
GOWN STRL REUS W/ TWL LRG LVL3 (GOWN DISPOSABLE) ×1 IMPLANT
GOWN STRL REUS W/TWL LRG LVL3 (GOWN DISPOSABLE) ×3
GOWN STRL REUS W/TWL XL LVL3 (GOWN DISPOSABLE) ×3 IMPLANT
IV LACTATED RINGERS 500ML (IV SOLUTION) ×3 IMPLANT
NEEDLE HYPO 25X1 1.5 SAFETY (NEEDLE) ×3 IMPLANT
NS IRRIG 1000ML POUR BTL (IV SOLUTION) ×3 IMPLANT
PACK BASIN DAY SURGERY FS (CUSTOM PROCEDURE TRAY) ×3 IMPLANT
PAD CAST 3X4 CTTN HI CHSV (CAST SUPPLIES) ×1 IMPLANT
PADDING CAST ABS 4INX4YD NS (CAST SUPPLIES) ×2
PADDING CAST ABS COTTON 4X4 ST (CAST SUPPLIES) ×1 IMPLANT
PADDING CAST COTTON 3X4 STRL (CAST SUPPLIES) ×3
PADDING UNDERCAST 2 STRL (CAST SUPPLIES)
PADDING UNDERCAST 2X4 STRL (CAST SUPPLIES) IMPLANT
PASSER SUT SWANSON 36MM LOOP (INSTRUMENTS) IMPLANT
SHEET MEDIUM DRAPE 40X70 STRL (DRAPES) ×3 IMPLANT
SLEEVE SCD COMPRESS KNEE MED (MISCELLANEOUS) IMPLANT
SPEAR EYE SURG WECK-CEL (MISCELLANEOUS) IMPLANT
SPLINT PLASTER CAST XFAST 4X15 (CAST SUPPLIES) ×15 IMPLANT
SPLINT PLASTER XTRA FAST SET 4 (CAST SUPPLIES) ×30
STOCKINETTE 4X48 STRL (DRAPES) ×3 IMPLANT
STRIP CLOSURE SKIN 1/2X4 (GAUZE/BANDAGES/DRESSINGS) IMPLANT
SUT ETHIBOND 3-0 V-5 (SUTURE) IMPLANT
SUT ETHILON 4 0 PS 2 18 (SUTURE) ×3 IMPLANT
SUT ETHILON 5 0 PS 2 18 (SUTURE) IMPLANT
SUT FIBERWIRE 3-0 18 TAPR NDL (SUTURE)
SUT FIBERWIRE 4-0 18 TAPR NDL (SUTURE)
SUT NYLON 9 0 VRM6 (SUTURE) ×3 IMPLANT
SUT PROLENE 3 0 PS 2 (SUTURE) IMPLANT
SUT PROLENE 6 0 P 1 18 (SUTURE) ×6 IMPLANT
SUT PROLENE 6 0 PC 1 (SUTURE) ×6 IMPLANT
SUT VICRYL RAPIDE 4-0 (SUTURE) IMPLANT
SUT VICRYL RAPIDE 4/0 PS 2 (SUTURE) IMPLANT
SUTURE FIBERWR 3-0 18 TAPR NDL (SUTURE) IMPLANT
SUTURE FIBERWR 4-0 18 TAPR NDL (SUTURE) IMPLANT
SYR 10ML LL (SYRINGE) ×3 IMPLANT
SYR BULB 3OZ (MISCELLANEOUS) ×3 IMPLANT
TOWEL OR 17X24 6PK STRL BLUE (TOWEL DISPOSABLE) ×3 IMPLANT
TUBE FEEDING ENTERAL 5FR 16IN (TUBING) IMPLANT
UNDERPAD 30X30 (UNDERPADS AND DIAPERS) ×3 IMPLANT

## 2017-03-05 NOTE — H&P (Signed)
Harold BundeJamal Oneill Harold Oneill is an 31 y.o. male.   Chief Complaint: Left hand digital lacerations with decreased range of motion and numbness HPI: Patient is a 31 year old left-hand-dominant male who was trying to cut frozen steak and accidentally lacerated the palmar aspects of his left index, long, and ring fingers. He was seen at an outside hospital where sutures were placed. He presents today with decreased range of motion numbness in these digits.  Past Medical History:  Diagnosis Date  . Finger laceration involving tendon 03/01/2017   left index, long, ring fingers  . Heartburn    occasional - TUMS as needed  . Seasonal allergies     Past Surgical History:  Procedure Laterality Date  . NO PAST SURGERIES      History reviewed. No pertinent family history. Social History:  reports that he has been smoking Cigarettes.  He has a 4.50 pack-year smoking history. He has never used smokeless tobacco. He reports that he drinks alcohol. He reports that he does not use drugs.  Allergies: No Known Allergies  Medications Prior to Admission  Medication Sig Dispense Refill  . amoxicillin-clavulanate (AUGMENTIN) 875-125 MG tablet Take 1 tablet by mouth every 12 (twelve) hours. 20 tablet 0  . HYDROcodone-acetaminophen (NORCO) 5-325 MG tablet Take 1 tablet by mouth every 6 (six) hours as needed for moderate pain. 15 tablet 0    No results found for this or any previous visit (from the past 48 hour(s)). No results found.  Review of Systems  All other systems reviewed and are negative.   Blood pressure 140/84, pulse 60, temperature 97.8 F (36.6 C), temperature source Oral, resp. rate 16, height 6\' 3"  (1.905 m), weight 85.4 kg (188 lb 3.2 oz), SpO2 100 %. Physical Exam  Constitutional: He is oriented to person, place, and time. He appears well-developed and well-nourished.  HENT:  Head: Normocephalic and atraumatic.  Neck: Normal range of motion.  Cardiovascular: Normal rate.   Respiratory: Effort  normal.  Musculoskeletal:       Left wrist: He exhibits tenderness and laceration.       Left hand: He exhibits decreased range of motion, tenderness and disruption of two-point discrimination.  Left index, long, and ring finger palmar lacerations over middle phalanx with decreased range of motion and sensation  Neurological: He is alert and oriented to person, place, and time.  Skin: Skin is warm.  Psychiatric: He has a normal mood and affect. His behavior is normal. Judgment and thought content normal.     Assessment/Plan As above. Plan on exploration and repairs necessary tendon, artery, and nerve to the left index, long, and ring fingers.  Marlowe ShoresMatthew A Alveria Mcglaughlin, MD 03/05/2017, 1:26 PM

## 2017-03-05 NOTE — Anesthesia Postprocedure Evaluation (Signed)
Anesthesia Post Note  Patient: Harold BundeJamal L [place]  Procedure(s) Performed: Procedure(s) (LRB): NERVE, TENDON AND ARTERY REPAIR INDEX LONG AND RING FINGER (Left)     Patient location during evaluation: PACU Anesthesia Type: General Level of consciousness: awake and alert Pain management: pain level controlled Vital Signs Assessment: post-procedure vital signs reviewed and stable Respiratory status: spontaneous breathing, nonlabored ventilation, respiratory function stable and patient connected to nasal cannula oxygen Cardiovascular status: blood pressure returned to baseline and stable Postop Assessment: no signs of nausea or vomiting Anesthetic complications: no    Last Vitals:  Vitals:   03/05/17 1645 03/05/17 1700  BP: (!) 145/92 (!) 144/82  Pulse: 78 60  Resp: (!) 21 18  Temp:  36.7 C  SpO2: 100% 100%    Last Pain:  Vitals:   03/05/17 1700  TempSrc:   PainSc: 0-No pain                 Ryan P Ellender

## 2017-03-05 NOTE — Discharge Instructions (Signed)

## 2017-03-05 NOTE — Anesthesia Preprocedure Evaluation (Addendum)
Anesthesia Evaluation  Patient identified by MRN, date of birth, ID band Patient awake    Reviewed: Allergy & Precautions, NPO status , Patient's Chart, lab work & pertinent test results  Airway Mallampati: II  TM Distance: >3 FB Neck ROM: Full    Dental no notable dental hx.    Pulmonary Current Smoker,    Pulmonary exam normal breath sounds clear to auscultation       Cardiovascular negative cardio ROS Normal cardiovascular exam Rhythm:Regular Rate:Normal     Neuro/Psych negative neurological ROS  negative psych ROS   GI/Hepatic negative GI ROS, Neg liver ROS,   Endo/Other  negative endocrine ROS  Renal/GU negative Renal ROS     Musculoskeletal negative musculoskeletal ROS (+)   Abdominal   Peds  Hematology negative hematology ROS (+)   Anesthesia Other Findings   Reproductive/Obstetrics                             Anesthesia Physical Anesthesia Plan  ASA: II  Anesthesia Plan: General   Post-op Pain Management:    Induction: Intravenous  PONV Risk Score and Plan: 1 and Ondansetron and Dexamethasone  Airway Management Planned: LMA  Additional Equipment:   Intra-op Plan:   Post-operative Plan: Extubation in OR  Informed Consent: I have reviewed the patients History and Physical, chart, labs and discussed the procedure including the risks, benefits and alternatives for the proposed anesthesia with the patient or authorized representative who has indicated his/her understanding and acceptance.   Dental advisory given  Plan Discussed with: CRNA  Anesthesia Plan Comments: (Potential post op regional anesthesia discussed)       Anesthesia Quick Evaluation

## 2017-03-05 NOTE — Anesthesia Procedure Notes (Signed)
Procedure Name: LMA Insertion Date/Time: 03/05/2017 1:40 PM Performed by: Burna CashONRAD, Camron Monday C Pre-anesthesia Checklist: Patient identified, Emergency Drugs available, Suction available and Patient being monitored Patient Re-evaluated:Patient Re-evaluated prior to induction Oxygen Delivery Method: Circle system utilized Preoxygenation: Pre-oxygenation with 100% oxygen Induction Type: IV induction Ventilation: Mask ventilation without difficulty LMA: LMA inserted LMA Size: 5.0 Number of attempts: 1 Airway Equipment and Method: Bite block Placement Confirmation: positive ETCO2 and breath sounds checked- equal and bilateral Tube secured with: Tape Dental Injury: Teeth and Oropharynx as per pre-operative assessment

## 2017-03-05 NOTE — Transfer of Care (Signed)
Immediate Anesthesia Transfer of Care Note  Patient: Harold Oneill  Procedure(s) Performed: * No procedures listed *  Patient Location: PACU  Anesthesia Type:General  Level of Consciousness: sedated  Airway & Oxygen Therapy: Patient Spontanous Breathing and Patient connected to face mask oxygen  Post-op Assessment: Report given to RN and Post -op Vital signs reviewed and stable  Post vital signs: Reviewed and stable  Last Vitals:  Vitals:   03/05/17 1253  BP: 140/84  Pulse: 60  Resp: 16  Temp: 36.6 C  SpO2: 100%    Last Pain:  Vitals:   03/05/17 1253  TempSrc: Oral  PainSc: 8          Complications: No apparent anesthesia complications

## 2017-03-05 NOTE — Op Note (Signed)
Please see dictated note [phone]

## 2017-03-06 ENCOUNTER — Encounter (HOSPITAL_BASED_OUTPATIENT_CLINIC_OR_DEPARTMENT_OTHER): Payer: Self-pay | Admitting: Orthopedic Surgery

## 2017-03-06 NOTE — Op Note (Signed)
NAME:  [place]                    ACCOUNT NO.:  MEDICAL RECORD NO.:  [id number]  LOCATION:                                 FACILITY:  PHYSICIAN:  Christon Parada A. Ezriel Boffa, M.D.DATE OF BIRTH:  08-16-1985  DATE OF PROCEDURE:  03/05/2017 DATE OF DISCHARGE:                              OPERATIVE REPORT   PREOPERATIVE DIAGNOSIS:  Deep lacerations, palmar aspect, left index, long, and ring fingers.  POSTOPERATIVE DIAGNOSIS:  Deep lacerations, palmar aspect, left index, long, and ring fingers.  PROCEDURES: 1. Exploration of above with primary repair of left index finger     flexor digitorum profundus in zone 2 as well as ulnar digital nerve     repair microscopically. 2. Exploration repair of left long finger flexor digitorum profundus     tendon in zone 2 as well as microscopic repair of ulnar artery and     nerve digitally. 3. Exploration repair of left ring finger flexor digitorum,     superficialis and profundus tendons zone 2 as well as microscopic     repair of ulnar digital artery and nerve.  SURGEON:  Artist PaisMatthew A. Mina MarbleWeingold, M.D.  ASSISTANT:  None.  ANESTHESIA:  General.  COMPLICATION:  No complications.  DRAINS:  No drains.  DESCRIPTION OF PROCEDURE:  The patient was taken to the operating suite after induction of adequate general anesthetic, left upper extremity was prepped and draped in usual sterile fashion.  An Esmarch was used to exsanguinate the limb.  Tourniquet was inflated to 250 mmHg.  At this point in time, lacerations over the middle phalanxes of the index, long and ring finger were extended in gentle midlateral fashion with the proximal limb radial and the distal limb ulnar, flaps were raised. Dissection was carried down to the flexor sheaths.  There was a complete laceration on the index finger, the profundus tendon in zone 2 between the A2 and A4 pulley area, and laceration of the ulnar digital nerve in the long finger.  There was complete profundus  laceration in zone 2, and ulnar artery and nerve and in the ring finger once the superficialis tendon, the profundus tendon and the ulnar artery and nerve.  We repaired the profundus tendons using 3-0 double-armed Ethibond suture and a Tajima-type suture followed by 6-0 running locked Prolene epitendinous stitch.  We repaired the ring finger superficialis slip with a piece of the 3-0 Ethibond.  After the tendon repairs were completed, we then brought the microscope onto the field, and under microscopic magnification, we repaired radial digital artery and nerve of the left long and radial digital artery and nerve of the left ring.  At the end of the procedure, all the wounds were irrigated and then loosely closed with 4-0 nylon.  Xeroform, 4x4s, fluffs, and a dorsal extension block splint was applied.  The patient tolerated this procedure well, went to the recovery room in stable fashion.     Artist PaisMatthew A. Mina MarbleWeingold, M.D.     MAW/MEDQ  D:  03/05/2017  T:  03/06/2017  Job:  [id number]

## 2017-03-22 ENCOUNTER — Emergency Department
Admission: EM | Admit: 2017-03-22 | Discharge: 2017-03-22 | Disposition: A | Discharge status: Home / Self Care | Payer: Self-pay | Attending: Emergency Medicine | Admitting: Emergency Medicine

## 2017-03-22 ENCOUNTER — Encounter: Payer: Self-pay | Admitting: Emergency Medicine

## 2017-03-22 DIAGNOSIS — F17210 Nicotine dependence, cigarettes, uncomplicated: Secondary | ICD-10-CM | POA: Insufficient documentation

## 2017-03-22 DIAGNOSIS — S0181XA Laceration without foreign body of other part of head, initial encounter: Secondary | ICD-10-CM | POA: Insufficient documentation

## 2017-03-22 DIAGNOSIS — W540XXA Bitten by dog, initial encounter: Secondary | ICD-10-CM | POA: Insufficient documentation

## 2017-03-22 DIAGNOSIS — Y999 Unspecified external cause status: Secondary | ICD-10-CM | POA: Insufficient documentation

## 2017-03-22 DIAGNOSIS — Z23 Encounter for immunization: Secondary | ICD-10-CM | POA: Insufficient documentation

## 2017-03-22 DIAGNOSIS — Y929 Unspecified place or not applicable: Secondary | ICD-10-CM | POA: Insufficient documentation

## 2017-03-22 DIAGNOSIS — S0185XA Open bite of other part of head, initial encounter: Secondary | ICD-10-CM

## 2017-03-22 DIAGNOSIS — Y939 Activity, unspecified: Secondary | ICD-10-CM | POA: Insufficient documentation

## 2017-03-22 MED ORDER — HYDROCODONE-ACETAMINOPHEN 5-325 MG PO TABS
1.0000 | ORAL_TABLET | Freq: Once | ORAL | Status: AC
  Administered 2017-03-22: 1 via ORAL
  Filled 2017-03-22: qty 1

## 2017-03-22 MED ORDER — AMOXICILLIN-POT CLAVULANATE 875-125 MG PO TABS
1.0000 | ORAL_TABLET | Freq: Once | ORAL | Status: AC
  Administered 2017-03-22: 1 via ORAL
  Filled 2017-03-22: qty 1

## 2017-03-22 MED ORDER — HYDROCODONE-ACETAMINOPHEN 5-325 MG PO TABS: 1.0000 | ORAL_TABLET | Freq: Three times a day (TID) | ORAL | 0 refills | Status: DC | PRN

## 2017-03-22 MED ORDER — TETANUS-DIPHTH-ACELL PERTUSSIS 5-2.5-18.5 LF-MCG/0.5 IM SUSP
0.5000 mL | Freq: Once | INTRAMUSCULAR | Status: AC
  Administered 2017-03-22: 0.5 mL via INTRAMUSCULAR
  Filled 2017-03-22: qty 0.5

## 2017-03-22 MED ORDER — LIDOCAINE HCL (PF) 1 % IJ SOLN
5.0000 mL | Freq: Once | INTRAMUSCULAR | Status: AC
  Administered 2017-03-22: 5 mL
  Filled 2017-03-22: qty 5

## 2017-03-22 MED ORDER — BACITRACIN ZINC 500 UNIT/GM EX OINT
TOPICAL_OINTMENT | Freq: Once | CUTANEOUS | Status: AC
  Administered 2017-03-22: 1 via TOPICAL
  Filled 2017-03-22: qty 0.9

## 2017-03-22 MED ORDER — AMOXICILLIN-POT CLAVULANATE 875-125 MG PO TABS: 1.0000 | ORAL_TABLET | Freq: Two times a day (BID) | ORAL | 0 refills | Status: DC

## 2017-03-22 NOTE — ED Notes (Signed)
See triage note   Presents with dog bite to face  Bleeding controlled  States it was neighbors dog

## 2017-03-22 NOTE — ED Provider Notes (Signed)
[facility] Emergency Department Provider Note ____________________________________________  Time seen: 1749  I have reviewed the triage vital signs and the nursing notes.  HISTORY  Chief Complaint  Animal Bite  HPI Harold Oneill [place] is a 31 y.o. male presents to the ED for evaluation of a dog bite to the chin. The patient describes he was bitten by a neighbor's dog, that he has see before. Apparently the dog was loose and approached the patient, jumped up, and bit him on bit him in the chin.He denies any other injury at this time. He notes his tetanus is out of date. He notified the officer in the lobby about the dog attack, but has not been contacted by animal control.   Past Medical History:  Diagnosis Date  . Finger laceration involving tendon 03/01/2017   left index, long, ring fingers  . Heartburn    occasional - TUMS as needed  . Seasonal allergies     There are no active problems to display for this patient.   Past Surgical History:  Procedure Laterality Date  . NERVE, TENDON AND ARTERY REPAIR Left 03/05/2017   Procedure: NERVE, TENDON AND ARTERY REPAIR INDEX LONG AND RING FINGER;  Surgeon: Dairl Ponder, MD;  Location: [facility];  Service: Orthopedics;  Laterality: Left;  . NO PAST SURGERIES      Prior to Admission medications   Medication Sig Start Date End Date Taking? Authorizing Provider  amoxicillin-clavulanate (AUGMENTIN) 875-125 MG tablet Take 1 tablet by mouth 2 (two) times daily. 03/22/17   Delron Comer, Charlesetta Ivory, PA-C  HYDROcodone-acetaminophen (NORCO) 5-325 MG tablet Take 1 tablet by mouth 3 (three) times daily as needed. 03/22/17   Aeris Hersman, Charlesetta Ivory, PA-C  oxyCODONE-acetaminophen (ROXICET) 5-325 MG tablet Take 1 tablet by mouth every 6 (six) hours as needed. 03/05/17 03/05/18  Dairl Ponder, MD    Allergies Patient has no known allergies.  History reviewed. No pertinent family history.  Social  History Social History  Substance Use Topics  . Smoking status: Current Every Day Smoker    Packs/day: 0.50    Years: 9.00    Types: Cigarettes  . Smokeless tobacco: Never Used  . Alcohol use Yes     Comment: occasionally    Review of Systems  Constitutional: Negative for fever. Cardiovascular: Negative for chest pain. Respiratory: Negative for shortness of breath. Gastrointestinal: Negative for abdominal pain, vomiting and diarrhea. Musculoskeletal: Negative for back pain. Skin: Negative for rash. Chin lac due to dog bite ____________________________________________  PHYSICAL EXAM:  VITAL SIGNS: ED Triage Vitals  Enc Vitals Group     BP 03/22/17 1711 (!) 168/94     Pulse Rate 03/22/17 1711 (!) 113     Resp 03/22/17 1711 16     Temp 03/22/17 1711 98.1 F (36.7 C)     Temp Source 03/22/17 1711 Oral     SpO2 03/22/17 1711 100 %     Weight 03/22/17 1711 188 lb (85.3 kg)     Height --      Head Circumference --      Peak Flow --      Pain Score 03/22/17 1710 10     Pain Loc --      Pain Edu? --      Excl. in GC? --     Constitutional: Alert and oriented. Well appearing and in no distress. Head: Normocephalic and atraumatic. Patient with large laceration into the subcutaneous tissues of the right chin.  Eyes:  Conjunctivae are normal. Normal extraocular movements Cardiovascular: Normal rate, regular rhythm. Normal distal pulses. Respiratory: Normal respiratory effort. No wheezes/rales/rhonchi. Gastrointestinal: Soft and nontender. No distention. Musculoskeletal: Nontender with normal range of motion in all extremities.  Neurologic:  Normal gait without ataxia. Normal speech and language. No gross focal neurologic deficits are appreciated. Skin:  Skin is warm, dry and intact. No rash noted. ____________________________________________  PROCEDURES  Tdap 0.5 ml IM Augmentin 875 mg PO Norco 5-325 mg PO  LACERATION REPAIR Performed by: Lissa Hoard Authorized by: Lissa Hoard Consent: Verbal consent obtained. Risks and benefits: risks, benefits and alternatives were discussed Consent given by: patient Patient identity confirmed: provided demographic data Prepped and Draped in normal sterile fashion Wound explored  Laceration Location: chin  Laceration Length: 5 cm  No Foreign Bodies seen or palpated  Anesthesia: regional infiltration - right mental foramen  Local anesthetic: lidocaine 1% w/o epinephrine  Anesthetic total: 3 ml  Irrigation method: syringe Amount of cleaning: standard  Skin closure: 4-0 prolene  Number of sutures: 7   Technique: interrupted with long tails  Patient tolerance: Patient tolerated the procedure well with no immediate complications. ____________________________________________  INITIAL IMPRESSION / ASSESSMENT AND PLAN / ED COURSE  Patient with the ED management of an unprovoked dog bite to the face. He was bitten on the chin by a neighbor's dog which he had no prior contact. His tetanus is updated and he is started on Augmentin. Norco #10 is provided for pain relief. He will follow-up with [facility] for further wound care and management. He should follow-up with animal control for rabies status on the dog.  ____________________________________________  FINAL CLINICAL IMPRESSION(S) / ED DIAGNOSES  Final diagnoses:  Dog bite of face, initial encounter  Facial laceration, initial encounter      Lissa Hoard, PA-C 03/22/17 2102    Arnaldo Natal, MD 03/22/17 2231

## 2017-03-22 NOTE — ED Notes (Signed)

## 2017-03-22 NOTE — Discharge Instructions (Addendum)
Keep the wound clean and covered. Wash with soap & water only. Put a small amount of antibiotic ointment on the wound to promote healing. Take the antibiotic as directed and the pain medicine as needed. Take OTC ibuprofen for additional pain relief. Follow-up with the [facility] on Monday, for further instructions.They should attempt to contact the animal's owners, to confirm rabies status.

## 2017-03-22 NOTE — ED Triage Notes (Signed)
Pt to ed with c/o dog bite to right lower face.  Bleeding controlled at this time.

## 2017-08-06 ENCOUNTER — Other Ambulatory Visit: Payer: Self-pay

## 2017-08-06 ENCOUNTER — Emergency Department
Admission: EM | Admit: 2017-08-06 | Discharge: 2017-08-06 | Disposition: A | Discharge status: Home / Self Care | Payer: Self-pay | Attending: Emergency Medicine | Admitting: Emergency Medicine

## 2017-08-06 DIAGNOSIS — W503XXA Accidental bite by another person, initial encounter: Secondary | ICD-10-CM

## 2017-08-06 DIAGNOSIS — Y999 Unspecified external cause status: Secondary | ICD-10-CM | POA: Insufficient documentation

## 2017-08-06 DIAGNOSIS — F17210 Nicotine dependence, cigarettes, uncomplicated: Secondary | ICD-10-CM | POA: Insufficient documentation

## 2017-08-06 DIAGNOSIS — S01551A Open bite of lip, initial encounter: Secondary | ICD-10-CM | POA: Insufficient documentation

## 2017-08-06 DIAGNOSIS — Y939 Activity, unspecified: Secondary | ICD-10-CM | POA: Insufficient documentation

## 2017-08-06 DIAGNOSIS — Y929 Unspecified place or not applicable: Secondary | ICD-10-CM | POA: Insufficient documentation

## 2017-08-06 DIAGNOSIS — S01511A Laceration without foreign body of lip, initial encounter: Secondary | ICD-10-CM

## 2017-08-06 MED ORDER — AMOXICILLIN-POT CLAVULANATE 875-125 MG PO TABS: 1.0000 | ORAL_TABLET | Freq: Two times a day (BID) | ORAL | 0 refills | Status: AC

## 2017-08-06 MED ORDER — AMOXICILLIN-POT CLAVULANATE 875-125 MG PO TABS
1.0000 | ORAL_TABLET | Freq: Once | ORAL | Status: AC
  Administered 2017-08-06: 1 via ORAL
  Filled 2017-08-06: qty 1

## 2017-08-06 MED ORDER — LIDOCAINE HCL (PF) 1 % IJ SOLN
5.0000 mL | Freq: Once | INTRAMUSCULAR | Status: AC
  Administered 2017-08-06: 5 mL
  Filled 2017-08-06: qty 5
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
## 2017-08-06 NOTE — Discharge Instructions (Signed)
Keep the wound clean, dry, and covered. Take the antibiotic as directed. The sutures will dissolve in a week or so.
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
## 2017-08-10 ENCOUNTER — Encounter: Payer: Self-pay | Admitting: Emergency Medicine

## 2017-08-10 ENCOUNTER — Emergency Department
Admission: EM | Admit: 2017-08-10 | Discharge: 2017-08-10 | Disposition: A | Discharge status: Home / Self Care | Payer: Self-pay | Attending: Student in an Organized Health Care Education/Training Program | Admitting: Student in an Organized Health Care Education/Training Program

## 2017-08-10 ENCOUNTER — Other Ambulatory Visit: Payer: Self-pay

## 2017-08-10 DIAGNOSIS — Z5189 Encounter for other specified aftercare: Secondary | ICD-10-CM | POA: Insufficient documentation

## 2017-08-10 DIAGNOSIS — S61412D Laceration without foreign body of left hand, subsequent encounter: Secondary | ICD-10-CM | POA: Insufficient documentation

## 2017-08-10 DIAGNOSIS — W269XXD Contact with unspecified sharp object(s), subsequent encounter: Secondary | ICD-10-CM | POA: Insufficient documentation

## 2017-08-10 DIAGNOSIS — F17210 Nicotine dependence, cigarettes, uncomplicated: Secondary | ICD-10-CM | POA: Insufficient documentation

## 2017-08-10 MED ORDER — SULFAMETHOXAZOLE-TRIMETHOPRIM 800-160 MG PO TABS: 1.0000 | ORAL_TABLET | Freq: Two times a day (BID) | ORAL | 0 refills | Status: AC

## 2017-08-10 MED ORDER — TRAMADOL HCL 50 MG PO TABS: 50.0000 mg | ORAL_TABLET | Freq: Four times a day (QID) | ORAL | 0 refills | Status: AC | PRN

## 2017-08-10 NOTE — ED Notes (Signed)
Patient ambulatory to lobby with steady gait and NAD noted. Verbalized understanding of discharge instructions and follow-up care.  

## 2017-08-10 NOTE — ED Triage Notes (Signed)
Pt in via POV; pt with stiches removed in October from left hand, one stitch remains, reports puss from area.  Area appears clean, dry at this time, swelling noted to middle finger.  Pt reports pain.  NAD noted at this time.

## 2017-08-10 NOTE — ED Provider Notes (Signed)
[facility] Emergency Department Provider Note  ____________________________________________  Time seen: Approximately 3:36 PM  I have reviewed the triage vital signs and the nursing notes.   HISTORY  Chief Complaint Hand Pain    HPI Harold Oneill is a 32 y.o. male presents to the emergency department for a wound check.  Patient sustained flexor tendon lacerations of the second third and fifth left digits on 03/01/2017.  Patient has been under the care of Dr. Mina MarbleWeingold.  Dr. Mina MarbleWeingold performed flexor tendon laceration repair of the left hand and patient has been undergoing physical therapy.  Patient underwent surgery on 04/07/2017.  Patient has a residual suture that became visible today with expression of purulent exudate.  Patient became concerned.  He reports that he has gone to physical therapy "a few times".  He has a baseline of 25 degrees of flexion and aforementioned affected digits.  Patient has a palpable bulge along the proximal aspect of the left middle finger.  No surrounding cellulitis.  No sausage digit.  No alleviating measures have been attempted.   Past Medical History:  Diagnosis Date  . Finger laceration involving tendon 03/01/2017   left index, long, ring fingers  . Heartburn    occasional - TUMS as needed  . Seasonal allergies     There are no active problems to display for this patient.   Past Surgical History:  Procedure Laterality Date  . NERVE, TENDON AND ARTERY REPAIR Left 03/05/2017   Procedure: NERVE, TENDON AND ARTERY REPAIR INDEX LONG AND RING FINGER;  Surgeon: Dairl PonderWeingold, Matthew, MD;  Location: [facility];  Service: Orthopedics;  Laterality: Left;  . NO PAST SURGERIES      Prior to Admission medications   Medication Sig Start Date End Date Taking? Authorizing Provider  amoxicillin-clavulanate (AUGMENTIN) 875-125 MG tablet Take 1 tablet by mouth 2 (two) times daily for 10 days. 08/06/17 08/16/17  Menshew, Charlesetta IvoryJenise V  Bacon, PA-C  HYDROcodone-acetaminophen (NORCO) 5-325 MG tablet Take 1 tablet by mouth 3 (three) times daily as needed. 03/22/17   Menshew, Charlesetta IvoryJenise V Bacon, PA-C  oxyCODONE-acetaminophen (ROXICET) 5-325 MG tablet Take 1 tablet by mouth every 6 (six) hours as needed. 03/05/17 03/05/18  Dairl PonderWeingold, Matthew, MD  sulfamethoxazole-trimethoprim (BACTRIM DS,SEPTRA DS) 800-160 MG tablet Take 1 tablet by mouth 2 (two) times daily for 7 days. 08/10/17 08/17/17  Orvil FeilWoods, Derryck Shahan M, PA-C  traMADol (ULTRAM) 50 MG tablet Take 1 tablet (50 mg total) by mouth every 6 (six) hours as needed for up to 3 days. 08/10/17 08/13/17  Orvil FeilWoods, Quanda Pavlicek M, PA-C    Allergies Patient has no known allergies.  No family history on file.  Social History Social History   Tobacco Use  . Smoking status: Current Every Day Smoker    Packs/day: 0.25    Years: 9.00    Pack years: 2.25    Types: Cigarettes  . Smokeless tobacco: Never Used  Substance Use Topics  . Alcohol use: Yes    Comment: occasionally  . Drug use: No     Review of Systems  Constitutional: No fever/chills Eyes: No visual changes. No discharge ENT: No upper respiratory complaints. Cardiovascular: no chest pain. Respiratory: no cough. No SOB. Musculoskeletal: Patient has left hand pain.  Skin: Negative for rash, abrasions, lacerations, ecchymosis. Neurological: Negative for headaches, focal weakness or numbness.   ____________________________________________   PHYSICAL EXAM:  VITAL SIGNS: ED Triage Vitals  Enc Vitals Group     BP 08/10/17 1443 (!) 151/95  Pulse Rate 08/10/17 1443 93     Resp 08/10/17 1443 16     Temp 08/10/17 1443 98.3 F (36.8 C)     Temp Source 08/10/17 1443 Oral     SpO2 08/10/17 1443 99 %     Weight 08/10/17 1444 185 lb (83.9 kg)     Height 08/10/17 1444 6\' 3"  (1.905 m)     Head Circumference --      Peak Flow --      Pain Score 08/10/17 1450 10     Pain Loc --      Pain Edu? --      Excl. in GC? --       Constitutional: Alert and oriented. Well appearing and in no acute distress. Eyes: Conjunctivae are normal. PERRL. EOMI. Head: Atraumatic. Cardiovascular: Normal rate, regular rhythm. Normal S1 and S2.  Good peripheral circulation. Respiratory: Normal respiratory effort without tachypnea or retractions. Lungs CTAB. Good air entry to the bases with no decreased or absent breath sounds. Musculoskeletal: Patient is able to move all 5 left digits.  Left middle finger has a suture exposed with no not visible.  Suture could not be easily removed despite modest traction.  No expression of purulent exudate occurred.  No surrounding cellulitis.  A palpable bulge was appreciated at the proximal aspect of left middle finger.  Patient reports deformity has been present since time of injury.  Palpable radial pulse, left. Neurologic:  Normal speech and language. No gross focal neurologic deficits are appreciated.  Skin:  Skin is warm, dry and intact. No rash noted. Psychiatric: Mood and affect are normal. Speech and behavior are normal. Patient exhibits appropriate insight and judgement.   ____________________________________________   LABS (all labs ordered are listed, but only abnormal results are displayed)  Labs Reviewed - No data to display ____________________________________________  EKG   ____________________________________________  RADIOLOGY   No results found.  ____________________________________________    PROCEDURES  Procedure(s) performed:    Procedures    Medications - No data to display   ____________________________________________   INITIAL IMPRESSION / ASSESSMENT AND PLAN / ED COURSE  Pertinent labs & imaging results that were available during my care of the patient were reviewed by me and considered in my medical decision making (see chart for details).  Review of the [place] CSRS was performed in accordance of the [facility] prior to dispensing any controlled  drugs.     Assessment and plan Wound check Patient presents to the emergency department with hand pain.  Differential diagnosis included cellulitis and failed flexor tendon repair.  On physical exam, patient had a residual suture at the volar aspect of the left middle finger patient reports has been exuding purulent exudate.  As suture could not be removed with modest traction, patient was referred to orthopedic surgeon, Dr. Mina Marble as I do not want to potentially compromise flexor tendon repair.  The importance of appropriate follow-up with orthopedic surgery was emphasized during 3 instances of this emergency department encounter.  Patient voiced understanding.  Patient was started empirically on Bactrim.  No surrounding cellulitis was visualized on physical exam.  Vital signs are reassuring aside from hypertension.  Patient was discharged with a short course of tramadol after consulting the [place] drug database.  All patient questions were answered.     ____________________________________________  FINAL CLINICAL IMPRESSION(S) / ED DIAGNOSES  Final diagnoses:  Visit for wound check      NEW MEDICATIONS STARTED DURING THIS VISIT:  ED Discharge Orders  Ordered    sulfamethoxazole-trimethoprim (BACTRIM DS,SEPTRA DS) 800-160 MG tablet  2 times daily     08/10/17 1524    traMADol (ULTRAM) 50 MG tablet  Every 6 hours PRN     08/10/17 1526          This chart was dictated using voice recognition software/Dragon. Despite best efforts to proofread, errors can occur which can change the meaning. Any change was purely unintentional.    Orvil Feil, PA-C 08/10/17 1548    Willy Eddy, MD 08/10/17 1655

## 2018-01-05 ENCOUNTER — Other Ambulatory Visit: Payer: Self-pay | Admitting: Orthopedic Surgery

## 2018-01-05 ENCOUNTER — Encounter (HOSPITAL_BASED_OUTPATIENT_CLINIC_OR_DEPARTMENT_OTHER): Payer: Self-pay

## 2018-01-06 ENCOUNTER — Encounter (HOSPITAL_BASED_OUTPATIENT_CLINIC_OR_DEPARTMENT_OTHER): Payer: Self-pay | Admitting: *Deleted

## 2018-01-06 ENCOUNTER — Other Ambulatory Visit: Payer: Self-pay

## 2018-01-06 NOTE — Progress Notes (Signed)
Spoke w/ pt via phone for pre-op interview.  Npo after mn w/ exception clear liquids until 0730 (no cream/milk products).  Arrive at [id number].

## 2018-01-07 ENCOUNTER — Ambulatory Visit (HOSPITAL_BASED_OUTPATIENT_CLINIC_OR_DEPARTMENT_OTHER)
Admission: RE | Admit: 2018-01-07 | Discharge: 2018-01-07 | Disposition: A | Discharge status: Home / Self Care | Payer: Self-pay | Source: Ambulatory Visit | Attending: Orthopedic Surgery | Admitting: Orthopedic Surgery

## 2018-01-07 ENCOUNTER — Encounter (HOSPITAL_BASED_OUTPATIENT_CLINIC_OR_DEPARTMENT_OTHER)
Admission: RE | Disposition: A | Discharge status: Home / Self Care | Payer: Self-pay | Source: Ambulatory Visit | Attending: Orthopedic Surgery

## 2018-01-07 ENCOUNTER — Ambulatory Visit (HOSPITAL_BASED_OUTPATIENT_CLINIC_OR_DEPARTMENT_OTHER): Payer: Self-pay | Admitting: Anesthesiology

## 2018-01-07 ENCOUNTER — Encounter (HOSPITAL_BASED_OUTPATIENT_CLINIC_OR_DEPARTMENT_OTHER): Payer: Self-pay

## 2018-01-07 DIAGNOSIS — T8579XA Infection and inflammatory reaction due to other internal prosthetic devices, implants and grafts, initial encounter: Secondary | ICD-10-CM | POA: Insufficient documentation

## 2018-01-07 DIAGNOSIS — M65142 Other infective (teno)synovitis, left hand: Secondary | ICD-10-CM | POA: Insufficient documentation

## 2018-01-07 DIAGNOSIS — A4902 Methicillin resistant Staphylococcus aureus infection, unspecified site: Secondary | ICD-10-CM | POA: Insufficient documentation

## 2018-01-07 DIAGNOSIS — F17210 Nicotine dependence, cigarettes, uncomplicated: Secondary | ICD-10-CM | POA: Insufficient documentation

## 2018-01-07 DIAGNOSIS — X58XXXA Exposure to other specified factors, initial encounter: Secondary | ICD-10-CM | POA: Insufficient documentation

## 2018-01-07 HISTORY — DX: Other specified health status: Z78.9

## 2018-01-07 HISTORY — PX: SUTURE REMOVAL: SHX6354

## 2018-01-07 SURGERY — REMOVAL, SUTURE
Anesthesia: General | Laterality: Left

## 2018-01-07 MED ORDER — MIDAZOLAM HCL 2 MG/2ML IJ SOLN
INTRAMUSCULAR | Status: DC | PRN
  Administered 2018-01-07: 2 mg via INTRAVENOUS

## 2018-01-07 MED ORDER — HYDROMORPHONE HCL 1 MG/ML IJ SOLN
INTRAMUSCULAR | Status: AC
  Filled 2018-01-07: qty 1

## 2018-01-07 MED ORDER — ONDANSETRON HCL 4 MG/2ML IJ SOLN
4.0000 mg | Freq: Once | INTRAMUSCULAR | Status: DC | PRN
  Filled 2018-01-07: qty 2

## 2018-01-07 MED ORDER — BUPIVACAINE HCL (PF) 0.25 % IJ SOLN
INTRAMUSCULAR | Status: DC | PRN
  Administered 2018-01-07: 3 mL

## 2018-01-07 MED ORDER — LIDOCAINE 2% (20 MG/ML) 5 ML SYRINGE
INTRAMUSCULAR | Status: DC | PRN
  Administered 2018-01-07: 100 mg via INTRAVENOUS

## 2018-01-07 MED ORDER — MIDAZOLAM HCL 2 MG/2ML IJ SOLN
INTRAMUSCULAR | Status: AC
  Filled 2018-01-07: qty 2

## 2018-01-07 MED ORDER — LIDOCAINE 2% (20 MG/ML) 5 ML SYRINGE
INTRAMUSCULAR | Status: AC
  Filled 2018-01-07: qty 5

## 2018-01-07 MED ORDER — HYDROMORPHONE HCL 1 MG/ML IJ SOLN
0.2500 mg | INTRAMUSCULAR | Status: DC | PRN
  Administered 2018-01-07 (×2): 0.5 mg via INTRAVENOUS
  Filled 2018-01-07: qty 0.5

## 2018-01-07 MED ORDER — CHLORHEXIDINE GLUCONATE 4 % EX LIQD
60.0000 mL | Freq: Once | CUTANEOUS | Status: DC
  Filled 2018-01-07: qty 118

## 2018-01-07 MED ORDER — FENTANYL CITRATE (PF) 100 MCG/2ML IJ SOLN
INTRAMUSCULAR | Status: AC
  Filled 2018-01-07: qty 2

## 2018-01-07 MED ORDER — LACTATED RINGERS IV SOLN
INTRAVENOUS | Status: DC
  Administered 2018-01-07: 12:00:00 via INTRAVENOUS
  Filled 2018-01-07: qty 1000

## 2018-01-07 MED ORDER — PROPOFOL 10 MG/ML IV BOLUS
INTRAVENOUS | Status: DC | PRN
  Administered 2018-01-07: 50 mg via INTRAVENOUS
  Administered 2018-01-07: 150 mg via INTRAVENOUS
  Administered 2018-01-07: 50 mg via INTRAVENOUS

## 2018-01-07 MED ORDER — FENTANYL CITRATE (PF) 100 MCG/2ML IJ SOLN
INTRAMUSCULAR | Status: DC | PRN
  Administered 2018-01-07 (×2): 50 ug via INTRAVENOUS

## 2018-01-07 MED ORDER — ONDANSETRON HCL 4 MG/2ML IJ SOLN
INTRAMUSCULAR | Status: DC | PRN
  Administered 2018-01-07: 4 mg via INTRAVENOUS

## 2018-01-07 MED ORDER — ONDANSETRON HCL 4 MG/2ML IJ SOLN
INTRAMUSCULAR | Status: AC
  Filled 2018-01-07: qty 2

## 2018-01-07 MED ORDER — CEFAZOLIN SODIUM-DEXTROSE 2-4 GM/100ML-% IV SOLN
2.0000 g | INTRAVENOUS | Status: AC
  Administered 2018-01-07: 2 g via INTRAVENOUS
  Filled 2018-01-07: qty 100

## 2018-01-07 MED ORDER — MEPERIDINE HCL 25 MG/ML IJ SOLN
6.2500 mg | INTRAMUSCULAR | Status: DC | PRN
  Filled 2018-01-07: qty 1

## 2018-01-07 MED ORDER — CEFAZOLIN SODIUM-DEXTROSE 2-4 GM/100ML-% IV SOLN
INTRAVENOUS | Status: AC
  Filled 2018-01-07: qty 100

## 2018-01-07 MED ORDER — PROPOFOL 10 MG/ML IV BOLUS
INTRAVENOUS | Status: AC
Start: 2018-01-07 — End: ?
  Filled 2018-01-07: qty 20

## 2018-01-07 SURGICAL SUPPLY — 57 items
APL SKNCLS STERI-STRIP NONHPOA (GAUZE/BANDAGES/DRESSINGS)
BANDAGE ACE 3X5.8 VEL STRL LF (GAUZE/BANDAGES/DRESSINGS) IMPLANT
BANDAGE ACE 4X5 VEL STRL LF (GAUZE/BANDAGES/DRESSINGS) IMPLANT
BANDAGE COBAN STERILE 2 (GAUZE/BANDAGES/DRESSINGS) ×3 IMPLANT
BENZOIN TINCTURE PRP APPL 2/3 (GAUZE/BANDAGES/DRESSINGS) IMPLANT
BLADE MINI RND TIP GREEN BEAV (BLADE) IMPLANT
BLADE SURG 15 STRL LF DISP TIS (BLADE) ×1 IMPLANT
BLADE SURG 15 STRL SS (BLADE) ×3
BNDG CMPR 9X4 STRL LF SNTH (GAUZE/BANDAGES/DRESSINGS)
BNDG ELASTIC 2X5.8 VLCR STR LF (GAUZE/BANDAGES/DRESSINGS) IMPLANT
BNDG ESMARK 4X9 LF (GAUZE/BANDAGES/DRESSINGS) IMPLANT
BNDG GAUZE ELAST 4 BULKY (GAUZE/BANDAGES/DRESSINGS) ×3 IMPLANT
CLOSURE WOUND 1/2 X4 (GAUZE/BANDAGES/DRESSINGS)
CORD BIPOLAR FORCEPS 12FT (ELECTRODE) ×3 IMPLANT
COVER BACK TABLE 60X90IN (DRAPES) ×3 IMPLANT
CUFF TOURNIQUET SINGLE 18IN (TOURNIQUET CUFF) ×3 IMPLANT
DECANTER SPIKE VIAL GLASS SM (MISCELLANEOUS) IMPLANT
DRAPE EXTREMITY T 121X128X90 (DRAPE) ×3 IMPLANT
DRAPE LG THREE QUARTER DISP (DRAPES) ×3 IMPLANT
DRAPE SURG 17X23 STRL (DRAPES) ×3 IMPLANT
DURAPREP 26ML APPLICATOR (WOUND CARE) ×3 IMPLANT
GAUZE SPONGE 4X4 12PLY STRL (GAUZE/BANDAGES/DRESSINGS) ×6 IMPLANT
GAUZE XEROFORM 1X8 LF (GAUZE/BANDAGES/DRESSINGS) ×6 IMPLANT
GLOVE BIOGEL M STRL SZ7.5 (GLOVE) ×3 IMPLANT
GLOVE SURG SYN 8.0 (GLOVE) ×6 IMPLANT
GOWN STRL REUS W/ TWL LRG LVL3 (GOWN DISPOSABLE) ×1 IMPLANT
GOWN STRL REUS W/TWL LRG LVL3 (GOWN DISPOSABLE) ×3
GOWN STRL REUS W/TWL XL LVL3 (GOWN DISPOSABLE) ×6 IMPLANT
NDL SAFETY ECLIPSE 18X1.5 (NEEDLE) IMPLANT
NEEDLE HYPO 18GX1.5 SHARP (NEEDLE)
NEEDLE HYPO 25X1 1.5 SAFETY (NEEDLE) IMPLANT
NS IRRIG 1000ML POUR BTL (IV SOLUTION) ×3 IMPLANT
PACK BASIN DAY SURGERY FS (CUSTOM PROCEDURE TRAY) ×3 IMPLANT
PAD CAST 3X4 CTTN HI CHSV (CAST SUPPLIES) ×1 IMPLANT
PADDING CAST ABS 4INX4YD NS (CAST SUPPLIES)
PADDING CAST ABS COTTON 4X4 ST (CAST SUPPLIES) IMPLANT
PADDING CAST COTTON 3X4 STRL (CAST SUPPLIES) ×3
PADDING UNDERCAST 2 STRL (CAST SUPPLIES) ×2
PADDING UNDERCAST 2X4 STRL (CAST SUPPLIES) ×1 IMPLANT
SPLINT PLASTER CAST XFAST 4X15 (CAST SUPPLIES) IMPLANT
SPLINT PLASTER XTRA FAST SET 4 (CAST SUPPLIES)
STOCKINETTE 4X48 STRL (DRAPES) ×3 IMPLANT
STRIP CLOSURE SKIN 1/2X4 (GAUZE/BANDAGES/DRESSINGS) IMPLANT
SUT ETHILON 4 0 PS 2 18 (SUTURE) ×3 IMPLANT
SUT ETHILON 5 0 PS 2 18 (SUTURE) IMPLANT
SUT PROLENE 3 0 PS 2 (SUTURE) IMPLANT
SUT VIC AB 2-0 SH 27 (SUTURE)
SUT VIC AB 2-0 SH 27XBRD (SUTURE) IMPLANT
SUT VICRYL RAPIDE 4-0 (SUTURE) IMPLANT
SUT VICRYL RAPIDE 4/0 PS 2 (SUTURE) IMPLANT
SYR 10ML LL (SYRINGE) ×3 IMPLANT
SYR 5ML LL (SYRINGE) IMPLANT
SYR BULB 3OZ (MISCELLANEOUS) ×3 IMPLANT
SYR CONTROL 10ML LL (SYRINGE) IMPLANT
TOWEL OR 17X24 6PK STRL BLUE (TOWEL DISPOSABLE) ×3 IMPLANT
TUBE FEEDING ENTERAL 5FR 16IN (TUBING) IMPLANT
UNDERPAD 30X30 (UNDERPADS AND DIAPERS) ×3 IMPLANT

## 2018-01-07 NOTE — Anesthesia Procedure Notes (Signed)
Procedure Name: LMA Insertion Date/Time: 01/07/2018 1:17 PM Performed by: Tyrone NineSauve, Boone Gear F, CRNA Pre-anesthesia Checklist: Timeout performed, Patient identified, Emergency Drugs available, Suction available and Patient being monitored Patient Re-evaluated:Patient Re-evaluated prior to induction Oxygen Delivery Method: Circle system utilized Preoxygenation: Pre-oxygenation with 100% oxygen Induction Type: IV induction Ventilation: Mask ventilation without difficulty LMA: LMA inserted LMA Size: 5.0 Number of attempts: 1 Placement Confirmation: positive ETCO2,  CO2 detector and breath sounds checked- equal and bilateral Tube secured with: Tape Dental Injury: Teeth and Oropharynx as per pre-operative assessment

## 2018-01-07 NOTE — Transfer of Care (Signed)
Immediate Anesthesia Transfer of Care Note  Patient: Harold Oneill  Procedure(s) Performed: REMOVAL OF LEFT INDEX, LONG AND RING FINGER RETAINED SUTURE WITH JOINT MANIPULATIONS (Left )  Patient Location: PACU  Anesthesia Type:General  Level of Consciousness: awake, alert , oriented and patient cooperative  Airway & Oxygen Therapy: Patient Spontanous Breathing and Patient connected to nasal cannula oxygen  Post-op Assessment: Report given to RN and Post -op Vital signs reviewed and stable  Post vital signs: Reviewed and stable  Last Vitals:  Vitals Value Taken Time  BP 147/101 01/07/2018  2:05 PM  Temp    Pulse 65 01/07/2018  2:07 PM  Resp 19 01/07/2018  2:07 PM  SpO2 100 % 01/07/2018  2:07 PM  Vitals shown include unvalidated device data.  Last Pain:  Vitals:   01/07/18 1212  TempSrc:   PainSc: 7       Patients Stated Pain Goal: 3 (01/07/18 1212)  Complications: No apparent anesthesia complications

## 2018-01-07 NOTE — Anesthesia Postprocedure Evaluation (Signed)
Anesthesia Post Note  Patient: Harold Oneill [place]  Procedure(s) Performed: REMOVAL OF LEFT INDEX, LONG AND RING FINGER RETAINED SUTURE WITH JOINT MANIPULATIONS (Left )     Patient location during evaluation: PACU Anesthesia Type: General Level of consciousness: awake and alert Pain management: pain level controlled Vital Signs Assessment: post-procedure vital signs reviewed and stable Respiratory status: spontaneous breathing, nonlabored ventilation, respiratory function stable and patient connected to nasal cannula oxygen Cardiovascular status: blood pressure returned to baseline and stable Postop Assessment: no apparent nausea or vomiting Anesthetic complications: no    Last Vitals:  Vitals:   01/07/18 1415 01/07/18 1430  BP: (!) 139/96 (!) 135/101  Pulse: 74 71  Resp: 20 14  Temp:    SpO2: 100% 100%    Last Pain:  Vitals:   01/07/18 1430  TempSrc:   PainSc: 9                  Toshie Demelo DAVID

## 2018-01-07 NOTE — Op Note (Signed)
Please see dictated report [phone]#[id number]

## 2018-01-07 NOTE — Op Note (Signed)
NAME: [place] Harold L. MEDICAL RECORD AO:[id number] ACCOUNT [id number] DATE OF BIRTH:January 18, 1986 FACILITY: [facility] LOCATION: [facility] PHYSICIAN:Vaanya Shambaugh A. Mina MarbleWEINGOLD, MD  OPERATIVE REPORT  DATE OF PROCEDURE:  01/07/2018  PREOPERATIVE DIAGNOSIS:  Status post left index, long, and ring tendon, artery and nerve repair.  POSTOPERATIVE DIAGNOSIS:  Status post left index, long, and ring tendon, artery and nerve repair.  PROCEDURE:  Removal of deep intubated suture from the index, long, and ring finger flexor sheath as well as flexor tenolysis of left index, long, and ring fingers.  SURGEON:  Dairl PonderMatthew Mosella Kasa, MD  ASSISTANT:  None.  ANESTHESIA:  General.  COMPLICATIONS:  None.  DRAINS:  No drains.  CULTURES:  Sent.  SPECIMENS:  None.  DESCRIPTION OF PROCEDURE:  The patient was taken to the operating suite after induction of adequate general anesthetic, left upper extremity was prepped and draped in the usual sterile fashion.  An Esmarch was used to exsanguinate the limb and the  tourniquet was inflated to 250 mmHg.  At this point in time, the left hand was fully supinated on the operating room table and placed in a lead hand.  There was evidence of a reaction to the underlying flexor tendon suture placed previously for his  repairs over the proximal middle phalanx of the index finger, proximal phalanx of the long finger and the middle phalanx of the ring finger.  We incised in an oblique fashion across these raised, erythematous and indurated areas and removed the Ethibond  suture from the profundus repairs as well as the 6-0 Prolene epitendinous type stitches.  Once this was done, we opened the flexor sheath and the cruciate areas preserving pulleys and performed a limited tenolysis of the left index, long, and ring  fingers with gentle manipulations of the distal interphalangeal and proximal interphalangeal joints.  The wounds were thoroughly irrigated.  Cultures were obtained prior to  irrigation and the left index, long and ring fingers were loosely closed with 4-0  nylon.  Xeroform, 4 x 4, and a compressive bandage was applied and the patient tolerated this procedure well and went to recovery in stable fashion.  TN/NUANCE  D:01/07/2018 T:01/07/2018 JOB:[id number]

## 2018-01-07 NOTE — Discharge Instructions (Signed)

## 2018-01-07 NOTE — Anesthesia Preprocedure Evaluation (Addendum)
Anesthesia Evaluation  Patient identified by MRN, date of birth, ID band Patient awake    Reviewed: Allergy & Precautions, NPO status , Patient's Chart, lab work & pertinent test results  Airway Mallampati: I  TM Distance: >3 FB Neck ROM: Full    Dental  (+) Teeth Intact, Dental Advisory Given   Pulmonary Current Smoker,    Pulmonary exam normal        Cardiovascular Exercise Tolerance: Good Normal cardiovascular exam     Neuro/Psych    GI/Hepatic   Endo/Other    Renal/GU      Musculoskeletal   Abdominal   Peds  Hematology   Anesthesia Other Findings   Reproductive/Obstetrics                           Anesthesia Physical Anesthesia Plan  ASA: II  Anesthesia Plan: General   Post-op Pain Management:    Induction: Intravenous  PONV Risk Score and Plan: 1 and Ondansetron  Airway Management Planned: LMA  Additional Equipment:   Intra-op Plan:   Post-operative Plan: Extubation in OR  Informed Consent: I have reviewed the patients History and Physical, chart, labs and discussed the procedure including the risks, benefits and alternatives for the proposed anesthesia with the patient or authorized representative who has indicated his/her understanding and acceptance.     Plan Discussed with: CRNA and Surgeon  Anesthesia Plan Comments:         Anesthesia Quick Evaluation

## 2018-01-07 NOTE — H&P (Signed)
Harold Oneill is an 32 y.o. male.   Chief Complaint: left hand pain and loss of motion HPI: patient's very pleasant [age] year old male right-hand dominant was 1 year status post repair of left index, long, and ring finger tendon artery and nerves. Patient presents with swelling and skin breakdown over FiberWire suture from tendon repairs as well as significant contracture.  Past Medical History:  Diagnosis Date  . History of dog bite    02/ 2019  . Seasonal allergies     Past Surgical History:  Procedure Laterality Date  . NERVE, TENDON AND ARTERY REPAIR Left 03/05/2017   Procedure: NERVE, TENDON AND ARTERY REPAIR INDEX LONG AND RING FINGER;  Surgeon: Dairl PonderWeingold, Peta Peachey, MD;  Location: [facility];  Service: Orthopedics;  Laterality: Left;    History reviewed. No pertinent family history. Social History:  reports that he has been smoking cigarettes.  He has a 5.00 pack-year smoking history. He has never used smokeless tobacco. He reports that he drinks alcohol. He reports that he does not use drugs.  Allergies: No Known Allergies  Medications Prior to Admission  Medication Sig Dispense Refill  . ibuprofen (ADVIL,MOTRIN) 200 MG tablet Take 200 mg by mouth every 6 (six) hours as needed.    . calcium carbonate (TUMS - DOSED IN MG ELEMENTAL CALCIUM) 500 MG chewable tablet Chew 1 tablet by mouth as needed for indigestion or heartburn.      No results found for this or any previous visit (from the past 48 hour(s)). No results found.  Review of Systems  All other systems reviewed and are negative.   Blood pressure (!) 152/89, pulse 72, temperature 98.2 F (36.8 C), temperature source Oral, resp. rate 16, height 6\' 3"  (1.905 m), weight 90.6 kg (199 lb 12.8 oz), SpO2 100 %. Physical Exam  Constitutional: He appears well-developed and well-nourished.  HENT:  Head: Normocephalic and atraumatic.  Neck: Normal range of motion.  Cardiovascular: Normal rate.  Respiratory:  Effort normal.  Musculoskeletal:       Left hand: He exhibits tenderness, deformity and swelling.  Left index, long, and ring flexion contracture with retained painful suture status post flexor tendon repairs in the past  Skin: Skin is warm. There is erythema.  Psychiatric: He has a normal mood and affect. His behavior is normal. Judgment and thought content normal.     Assessment/Plan [age] year old right-hand-dominant male status post left index, long, and ring tendon, artery, and nerve repairs in the past with decreased range of motion and retained painful deep sutures. Have discussed the role of expiration of above suture removal, joint manipulation, and possible Teno lysis as necessary. Patient understands the risks and benefits and wishes to proceed.  Harold ShoresMatthew A Shalom Ware, MD 01/07/2018, 12:54 PM

## 2018-01-08 ENCOUNTER — Encounter (HOSPITAL_BASED_OUTPATIENT_CLINIC_OR_DEPARTMENT_OTHER): Payer: Self-pay | Admitting: Orthopedic Surgery

## 2018-01-14 LAB — AEROBIC/ANAEROBIC CULTURE W GRAM STAIN (SURGICAL/DEEP WOUND)

## 2019-10-07 ENCOUNTER — Emergency Department: Payer: Self-pay

## 2019-10-07 ENCOUNTER — Other Ambulatory Visit: Payer: Self-pay

## 2019-10-07 ENCOUNTER — Emergency Department
Admission: EM | Admit: 2019-10-07 | Discharge: 2019-10-07 | Disposition: A | Discharge status: Home / Self Care | Payer: Self-pay | Attending: Emergency Medicine | Admitting: Emergency Medicine

## 2019-10-07 ENCOUNTER — Encounter: Payer: Self-pay | Admitting: Emergency Medicine

## 2019-10-07 DIAGNOSIS — S60413A Abrasion of left middle finger, initial encounter: Secondary | ICD-10-CM | POA: Insufficient documentation

## 2019-10-07 DIAGNOSIS — Y9339 Activity, other involving climbing, rappelling and jumping off: Secondary | ICD-10-CM | POA: Insufficient documentation

## 2019-10-07 DIAGNOSIS — S20221A Contusion of right back wall of thorax, initial encounter: Secondary | ICD-10-CM | POA: Insufficient documentation

## 2019-10-07 DIAGNOSIS — F17210 Nicotine dependence, cigarettes, uncomplicated: Secondary | ICD-10-CM | POA: Insufficient documentation

## 2019-10-07 DIAGNOSIS — S60512A Abrasion of left hand, initial encounter: Secondary | ICD-10-CM | POA: Insufficient documentation

## 2019-10-07 DIAGNOSIS — Y999 Unspecified external cause status: Secondary | ICD-10-CM | POA: Insufficient documentation

## 2019-10-07 DIAGNOSIS — Y929 Unspecified place or not applicable: Secondary | ICD-10-CM | POA: Insufficient documentation

## 2019-10-07 DIAGNOSIS — W1789XA Other fall from one level to another, initial encounter: Secondary | ICD-10-CM | POA: Insufficient documentation

## 2019-10-07 MED ORDER — CEPHALEXIN 500 MG PO CAPS: 500.0000 mg | ORAL_CAPSULE | Freq: Two times a day (BID) | ORAL | 0 refills | Status: AC

## 2019-10-07 MED ORDER — TRAMADOL HCL 50 MG PO TABS
50.0000 mg | ORAL_TABLET | Freq: Once | ORAL | Status: AC
  Administered 2019-10-07: 50 mg via ORAL
  Filled 2019-10-07: qty 1

## 2019-10-07 MED ORDER — CEPHALEXIN 500 MG PO CAPS
500.0000 mg | ORAL_CAPSULE | Freq: Once | ORAL | Status: AC
  Administered 2019-10-07: 06:00:00 500 mg via ORAL
  Filled 2019-10-07: qty 1

## 2019-10-07 MED ORDER — TRAMADOL HCL 50 MG PO TABS: 50.0000 mg | ORAL_TABLET | Freq: Four times a day (QID) | ORAL | 0 refills | Status: AC | PRN

## 2019-10-07 NOTE — ED Provider Notes (Signed)
[facility] Emergency Department Provider Note  ____________________________________________   First MD Initiated Contact with Patient 10/07/19 0602     (approximate)  I have reviewed the triage vital signs and the nursing notes.   HISTORY  Chief Complaint Fall, Back Pain, and Knee Pain   HPI Harold Oneill is a 34 y.o. male with below list of previous medical conditions presents to the emergency department secondary to accidental fall from a dumpster yesterday onto his back.  Patient denies any head injury no loss of consciousness.  Patient does admit however to upper back pain and laceration to the left middle finger.  Patient denies any fever.  Patient states current pain score is 10 out of 10.  She denies any alleviating factors.  Patient states pain worsened with palpation and movement        Past Medical History:  Diagnosis Date  . History of dog bite    02/ 2019  . Seasonal allergies     There are no problems to display for this patient.   Past Surgical History:  Procedure Laterality Date  . NERVE, TENDON AND ARTERY REPAIR Left 03/05/2017   Procedure: NERVE, TENDON AND ARTERY REPAIR INDEX LONG AND RING FINGER;  Surgeon: Dairl Ponder, MD;  Location: [facility];  Service: Orthopedics;  Laterality: Left;  . SUTURE REMOVAL Left 01/07/2018   Procedure: REMOVAL OF LEFT INDEX, LONG AND RING FINGER RETAINED SUTURE WITH JOINT MANIPULATIONS;  Surgeon: Dairl Ponder, MD;  Location: [facility] [place];  Service: Orthopedics;  Laterality: Left;    Prior to Admission medications   Medication Sig Start Date End Date Taking? Authorizing Provider  calcium carbonate (TUMS - DOSED IN MG ELEMENTAL CALCIUM) 500 MG chewable tablet Chew 1 tablet by mouth as needed for indigestion or heartburn.    [provider]  ibuprofen (ADVIL,MOTRIN) 200 MG tablet Take 200 mg by mouth every 6 (six) hours as needed.    [provider]    Allergies Patient has no known allergies.  No family history on file.  Social History Social History   Tobacco Use  . Smoking status: Current Every Day Smoker    Packs/day: 0.50    Years: 10.00    Pack years: 5.00    Types: Cigarettes  . Smokeless tobacco: Never Used  Substance Use Topics  . Alcohol use: Yes    Comment: occasionally  . Drug use: No    Review of Systems Constitutional: No fever/chills Eyes: No visual changes. ENT: No sore throat. Cardiovascular: Denies chest pain. Respiratory: Denies shortness of breath. Gastrointestinal: No abdominal pain.  No nausea, no vomiting.  No diarrhea.  No constipation. Genitourinary: Negative for dysuria. Musculoskeletal: Negative for neck pain.  Negative for back pain. Integumentary: Laceration to left  middle finger. Neurological: Negative for headaches, focal weakness or numbness.  ____________________________________________   PHYSICAL EXAM:  VITAL SIGNS: ED Triage Vitals  Enc Vitals Group     BP 10/07/19 0031 136/86     Pulse Rate 10/07/19 0031 77     Resp 10/07/19 0031 18     Temp 10/07/19 0031 98.4 F (36.9 C)     Temp Source 10/07/19 0031 Oral     SpO2 10/07/19 0031 97 %     Weight 10/07/19 0031 90.7 kg (200 lb)     Height 10/07/19 0031 1.88 m (6\' 2" )     Head Circumference --      Peak Flow --  Pain Score 10/07/19 0037 10     Pain Loc --      Pain Edu? --      Excl. in GC? --     Constitutional: Alert and oriented.  Eyes: Conjunctivae are normal.  Head: Atraumatic. Mouth/Throat: Patient is wearing a mask. Neck: No stridor.  No meningeal signs.   Cardiovascular: Normal rate, regular rhythm. Good peripheral circulation. Grossly normal heart sounds. Respiratory: Normal respiratory effort.  No retractions. Gastrointestinal: Soft and nontender. No distention.  Musculoskeletal: Abrasion noted left hand middle finger extending from the metacarpal phalangeal joint to the PIP joint.   No active signs of infection at present Neurologic:  Normal speech and language. No gross focal neurologic deficits are appreciated.  Skin:  Skin is warm, dry and intact. Psychiatric: Mood and affect are normal. Speech and behavior are normal.  __________  RADIOLOGY I, Newell N Tahj Njoku, personally viewed and evaluated these images (plain radiographs) as part of my medical decision making, as well as reviewing the written report by the radiologist.  ED MD interpretation: Lumbar spine and left knee and left hand x-ray all negative for acute findings per radiologist  Official radiology report(s): DG Lumbar Spine Complete  Result Date: 10/07/2019 CLINICAL DATA:  Post fall EXAM: LUMBAR SPINE - COMPLETE 4+ VIEW COMPARISON:  None. FINDINGS: Lumbar alignment within normal limits. Vertebral body heights are maintained. Mild disc space narrowing at L5-S1. IMPRESSION: No acute osseous abnormality.  Mild degenerative change at L5-S1 Electronically Signed   By: Jasmine Pang M.D.   On: 10/07/2019 01:14   DG Knee Complete 4 Views Left  Result Date: 10/07/2019 CLINICAL DATA:  Post fall EXAM: LEFT KNEE - COMPLETE 4+ VIEW COMPARISON:  None. FINDINGS: No evidence of fracture, dislocation, or joint effusion. No evidence of arthropathy or other focal bone abnormality. Soft tissues are unremarkable. IMPRESSION: Negative. Electronically Signed   By: Jasmine Pang M.D.   On: 10/07/2019 01:13   DG Hand Complete Left  Result Date: 10/07/2019 CLINICAL DATA:  Post fall EXAM: LEFT HAND - COMPLETE 3+ VIEW COMPARISON:  None. FINDINGS: Limited by flexed positioning of the digits. Old fifth metacarpal fracture. No acute displaced fracture or malalignment. IMPRESSION: No acute osseous abnormality.  Old fifth metacarpal fracture Electronically Signed   By: Jasmine Pang M.D.   On: 10/07/2019 01:14     Procedures   ____________________________________________   INITIAL IMPRESSION / MDM / ASSESSMENT AND PLAN / ED  COURSE  As part of my medical decision making, I reviewed the following data within the electronic MEDICAL RECORD NUMBER   34 year old male presented with above-stated history and physical exam with differential diagnosis including but not limited to compression fracture carpal metacarpal fracture versus contusion.  ____________________________________________  FINAL CLINICAL IMPRESSION(S) / ED DIAGNOSES  Final diagnoses:  Contusion of right side of back, initial encounter  Abrasion of left hand, initial encounter     MEDICATIONS GIVEN DURING THIS VISIT:  Medications  cephALEXin (KEFLEX) capsule 500 mg (has no administration in time range)  traMADol (ULTRAM) tablet 50 mg (has no administration in time range)     ED Discharge Orders    None      *Please note:  Harold Oneill was evaluated in Emergency Department on 10/07/2019 for the symptoms described in the history of present illness. He was evaluated in the context of the global COVID-19 pandemic, which necessitated consideration that the patient might be at risk for infection with the SARS-CoV-2 virus that causes COVID-19. Institutional protocols  and algorithms that pertain to the evaluation of patients at risk for COVID-19 are in a state of rapid change based on information released by regulatory bodies including the CDC and federal and state organizations. These policies and algorithms were followed during the patient's care in the ED.  Some ED evaluations and interventions may be delayed as a result of limited staffing during the pandemic.*  Note:  This document was prepared using Dragon voice recognition software and may include unintentional dictation errors.   Gregor Hams, MD 10/07/19 [phone]

## 2019-10-07 NOTE — ED Triage Notes (Signed)
Pt presents to ED after he fell last night at a restaurant and landed on his back. Now c/o lower back pain. Also c/o left knee pain and left hand pain. Small laceration noted to palm of left hand. ambulatory with steady gait. Denies hitting his head.

## 2019-10-07 NOTE — ED Notes (Signed)
Pt states that he was climbing the side of a dumpster. States he fell off and landed on his back. rerports pain in the back and is tender to palpation on the right upper near the shoulder. Pt has abrasion on the palm and fingers of the left hand.

## 2019-10-16 ENCOUNTER — Other Ambulatory Visit: Payer: Self-pay

## 2019-10-16 ENCOUNTER — Emergency Department: Payer: No Typology Code available for payment source

## 2019-10-16 ENCOUNTER — Emergency Department
Admission: EM | Admit: 2019-10-16 | Discharge: 2019-10-16 | Disposition: A | Discharge status: Home / Self Care | Payer: No Typology Code available for payment source | Attending: Emergency Medicine | Admitting: Emergency Medicine

## 2019-10-16 DIAGNOSIS — M25552 Pain in left hip: Secondary | ICD-10-CM | POA: Diagnosis not present

## 2019-10-16 DIAGNOSIS — F17210 Nicotine dependence, cigarettes, uncomplicated: Secondary | ICD-10-CM | POA: Insufficient documentation

## 2019-10-16 DIAGNOSIS — Z79899 Other long term (current) drug therapy: Secondary | ICD-10-CM | POA: Diagnosis not present

## 2019-10-16 MED ORDER — OXYCODONE-ACETAMINOPHEN 7.5-325 MG PO TABS: 1.0000 | ORAL_TABLET | Freq: Four times a day (QID) | ORAL | 0 refills | Status: AC | PRN

## 2019-10-16 MED ORDER — HYDROMORPHONE HCL 1 MG/ML IJ SOLN
1.0000 mg | Freq: Once | INTRAMUSCULAR | Status: AC
  Administered 2019-10-16: 1 mg via INTRAMUSCULAR
  Filled 2019-10-16: qty 1

## 2019-10-16 MED ORDER — IBUPROFEN 600 MG PO TABS: 600.0000 mg | ORAL_TABLET | Freq: Three times a day (TID) | ORAL | 0 refills | Status: AC | PRN

## 2019-10-16 MED ORDER — CYCLOBENZAPRINE HCL 10 MG PO TABS: 10.0000 mg | ORAL_TABLET | Freq: Three times a day (TID) | ORAL | 0 refills | Status: AC | PRN

## 2019-10-16 MED ORDER — KETOROLAC TROMETHAMINE 30 MG/ML IJ SOLN
60.0000 mg | Freq: Once | INTRAMUSCULAR | Status: AC
  Administered 2019-10-16: 60 mg via INTRAMUSCULAR
  Filled 2019-10-16: qty 2

## 2019-10-16 NOTE — Discharge Instructions (Signed)
Follow discharge care instruction take medication as directed. °

## 2019-10-16 NOTE — ED Notes (Signed)
PD here, pt in Xray
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
## 2019-12-28 ENCOUNTER — Telehealth: Payer: Self-pay | Admitting: General Practice

## 2019-12-28 NOTE — Telephone Encounter (Signed)
Individual has been contacted 3+ times regarding ED referral and has been given information regarding how to become a pt. No further attempts to contact individual will be made. 

## 2021-01-30 ENCOUNTER — Emergency Department
Admission: EM | Admit: 2021-01-30 | Discharge: 2021-01-30 | Disposition: A | Discharge status: Home / Self Care | Payer: Self-pay | Attending: Emergency Medicine | Admitting: Emergency Medicine

## 2021-01-30 ENCOUNTER — Other Ambulatory Visit: Payer: Self-pay

## 2021-01-30 ENCOUNTER — Encounter: Payer: Self-pay | Admitting: Emergency Medicine

## 2021-01-30 DIAGNOSIS — U071 COVID-19: Secondary | ICD-10-CM | POA: Insufficient documentation

## 2021-01-30 DIAGNOSIS — B349 Viral infection, unspecified: Secondary | ICD-10-CM | POA: Insufficient documentation

## 2021-01-30 LAB — RESP PANEL BY RT-PCR (FLU A&B, COVID) ARPGX2
Influenza A by PCR: NEGATIVE
Influenza B by PCR: NEGATIVE
SARS Coronavirus 2 by RT PCR: POSITIVE — AB

## 2021-01-30 MED ORDER — DICYCLOMINE HCL 10 MG PO CAPS: 10.0000 mg | ORAL_CAPSULE | Freq: Three times a day (TID) | ORAL | 0 refills | Status: AC

## 2021-01-30 MED ORDER — ALBUTEROL SULFATE HFA 108 (90 BASE) MCG/ACT IN AERS: 2.0000 | INHALATION_SPRAY | Freq: Four times a day (QID) | RESPIRATORY_TRACT | 2 refills | Status: AC | PRN

## 2021-01-30 MED ORDER — ONDANSETRON 4 MG PO TBDP: 4.0000 mg | ORAL_TABLET | Freq: Three times a day (TID) | ORAL | 0 refills | Status: AC | PRN

## 2021-01-30 NOTE — ED Provider Notes (Signed)
[facility]-EMERGENCY DEPARTMENT  ____________________________________________  Time seen: Approximately 4:11 PM  I have reviewed the triage vital signs and the nursing notes.   HISTORY  Chief Complaint Generalized Body Aches   Historian Patient    HPI Harold Oneill [place] is a 35 y.o. male presents to the ED with headache, bodyaches and chills since he finished work yesterday. States that he potentially has sick contacts at work. He denies chest pain, chest tightness, shortness of breath or cough at home No nasal congestion or rhinorrhea but has noticed some bilateral conjunctivitis. He has been able to tolerate PO without emesis or diarrhea at home.    Past Medical History:  Diagnosis Date   History of dog bite    02/ 2019   Seasonal allergies      Immunizations up to date:  Yes.     Past Medical History:  Diagnosis Date   History of dog bite    02/ 2019   Seasonal allergies     There are no problems to display for this patient.   Past Surgical History:  Procedure Laterality Date   NERVE, TENDON AND ARTERY REPAIR Left 03/05/2017   Procedure: NERVE, TENDON AND ARTERY REPAIR INDEX LONG AND RING FINGER;  Surgeon: Dairl Ponder, MD;  Location: [facility];  Service: Orthopedics;  Laterality: Left;   SUTURE REMOVAL Left 01/07/2018   Procedure: REMOVAL OF LEFT INDEX, LONG AND RING FINGER RETAINED SUTURE WITH JOINT MANIPULATIONS;  Surgeon: Dairl Ponder, MD;  Location: [facility];  Service: Orthopedics;  Laterality: Left;    Prior to Admission medications   Medication Sig Start Date End Date Taking? Authorizing Provider  albuterol (VENTOLIN HFA) 108 (90 Base) MCG/ACT inhaler Inhale 2 puffs into the lungs every 6 (six) hours as needed for wheezing or shortness of breath. 01/30/21  Yes Pia Mau M, PA-C  dicyclomine (BENTYL) 10 MG capsule Take 1 capsule (10 mg total) by mouth 4 (four) times daily -  before meals and at bedtime for 7 days.  01/30/21 02/06/21 Yes Pia Mau M, PA-C  ondansetron (ZOFRAN ODT) 4 MG disintegrating tablet Take 1 tablet (4 mg total) by mouth every 8 (eight) hours as needed for up to 5 days. 01/30/21 02/04/21 Yes Pia Mau M, PA-C  calcium carbonate (TUMS - DOSED IN MG ELEMENTAL CALCIUM) 500 MG chewable tablet Chew 1 tablet by mouth as needed for indigestion or heartburn.    [provider]  cyclobenzaprine (FLEXERIL) 10 MG tablet Take 1 tablet (10 mg total) by mouth 3 (three) times daily as needed. 10/16/19   Joni Reining, PA-C  ibuprofen (ADVIL) 600 MG tablet Take 1 tablet (600 mg total) by mouth every 8 (eight) hours as needed. 10/16/19   Joni Reining, PA-C  ibuprofen (ADVIL,MOTRIN) 200 MG tablet Take 200 mg by mouth every 6 (six) hours as needed.    [provider]  oxyCODONE-acetaminophen (PERCOCET) 7.5-325 MG tablet Take 1 tablet by mouth every 6 (six) hours as needed for severe pain. 10/16/19   Joni Reining, PA-C    Allergies Patient has no known allergies.  No family history on file.  Social History Social History   Tobacco Use   Smoking status: Every Day    Packs/day: 0.25    Years: 10.00    Pack years: 2.50    Types: Cigarettes   Smokeless tobacco: Never  Vaping Use   Vaping Use: Never used  Substance Use Topics   Alcohol use: Yes  Comment: occasionally   Drug use: No     Review of Systems  Constitutional: No fever/chills Eyes:  No discharge ENT: No upper respiratory complaints. Respiratory: no cough. No SOB/ use of accessory muscles to breath Gastrointestinal:   No nausea, no vomiting.  No diarrhea.  No constipation. Musculoskeletal: Negative for musculoskeletal pain. Neuro: Patient has headache and myalgias.  Skin: Negative for rash, abrasions, lacerations, ecchymosis.    ____________________________________________   PHYSICAL EXAM:  VITAL SIGNS: ED Triage Vitals  Enc Vitals Group     BP 01/30/21 1553 (!) 157/90     Pulse Rate 01/30/21  1553 79     Resp 01/30/21 1553 20     Temp 01/30/21 1553 98.7 F (37.1 C)     Temp Source 01/30/21 1553 Oral     SpO2 01/30/21 1553 98 %     Weight 01/30/21 1542 195 lb 1.7 oz (88.5 kg)     Height 01/30/21 1542 6\' 3"  (1.905 m)     Head Circumference --      Peak Flow --      Pain Score 01/30/21 1541 0     Pain Loc --      Pain Edu? --      Excl. in GC? --     Constitutional: Alert and oriented. Patient is lying supine. Eyes: Conjunctivae are normal. PERRL. EOMI. Head: Atraumatic. ENT:      Ears: Tympanic membranes are mildly injected with mild effusion bilaterally.       Nose: No congestion/rhinnorhea.      Mouth/Throat: Mucous membranes are moist. Posterior pharynx is mildly erythematous.  Hematological/Lymphatic/Immunilogical: No cervical lymphadenopathy.  Cardiovascular: Normal rate, regular rhythm. Normal S1 and S2.  Good peripheral circulation. Respiratory: Normal respiratory effort without tachypnea or retractions. Lungs CTAB. Good air entry to the bases with no decreased or absent breath sounds. Gastrointestinal: Bowel sounds 4 quadrants. Soft and nontender to palpation. No guarding or rigidity. No palpable masses. No distention. No CVA tenderness. Musculoskeletal: Full range of motion to all extremities. No gross deformities appreciated. Neurologic:  Normal speech and language. No gross focal neurologic deficits are appreciated.  Skin:  Skin is warm, dry and intact. No rash noted. Psychiatric: Mood and affect are normal. Speech and behavior are normal. Patient exhibits appropriate insight and judgement.   ____________________________________________   LABS (all labs ordered are listed, but only abnormal results are displayed)  Labs Reviewed  RESP PANEL BY RT-PCR (FLU A&B, COVID) [id number]   ____________________________________________  EKG   ____________________________________________  RADIOLOGY   No results  found.  ____________________________________________    PROCEDURES  Procedure(s) performed:     Procedures     Medications - No data to display   ____________________________________________   INITIAL IMPRESSION / ASSESSMENT AND PLAN / ED COURSE  Pertinent labs & imaging results that were available during my care of the patient were reviewed by me and considered in my medical decision making (see chart for details).    Assessment and Plan: Viral URI 35 y/o male presents to the ED with headache, myalgias and conjunctivitis for the past 24 hours.  Patient was hypertensive at triage but vital signs were otherwise reassuring.  Will obtain Covid 19 test and send patient home with medications to help with viral symptoms.   Return precautions were given to return with new or worsening symptoms. A work note was provided in patient's discharge paperwork. All patient questions were answered.       ____________________________________________  FINAL CLINICAL IMPRESSION(S) /  ED DIAGNOSES  Final diagnoses:  Viral illness      NEW MEDICATIONS STARTED DURING THIS VISIT:  ED Discharge Orders          Ordered    albuterol (VENTOLIN HFA) 108 (90 Base) MCG/ACT inhaler  Every 6 hours PRN        01/30/21 1606    ondansetron (ZOFRAN ODT) 4 MG disintegrating tablet  Every 8 hours PRN        01/30/21 1606    dicyclomine (BENTYL) 10 MG capsule  3 times daily before meals & bedtime        01/30/21 1606                This chart was dictated using voice recognition software/Dragon. Despite best efforts to proofread, errors can occur which can change the meaning. Any change was purely unintentional.     Orvil Feil, PA-C 01/30/21 1615    Sharman Cheek, MD 01/30/21 1815

## 2021-01-30 NOTE — Discharge Instructions (Addendum)
You can take 2 puffs of Albuterol as needed for wheezing. You can take one 4 mg tablet of Zofran every four hours as needed for nausea.  You can take one 10 mg tablet of Bentyl as needed for abdominal spasms.  You can check your Covid 19 testing results on MyChart. You can take 650 mg of Tylenol and 600 mg of Ibuprofen every six hours together or alternating.

## 2021-01-30 NOTE — ED Triage Notes (Signed)
C/O chills and bodyaches today.  AAOx3.  Skin warm and dry. NAD

## 2021-02-15 IMAGING — CR DG HAND COMPLETE 3+V*L*
1 series · 3 of 3 positions shown · non-contrast
Comparison: None.

CLINICAL DATA: Post fall

EXAM:
LEFT HAND - COMPLETE 3+ VIEW

[Series 1: dg hand complete left · 0.14mm/px · 3 of 3 slices shown]
[im 1/3]
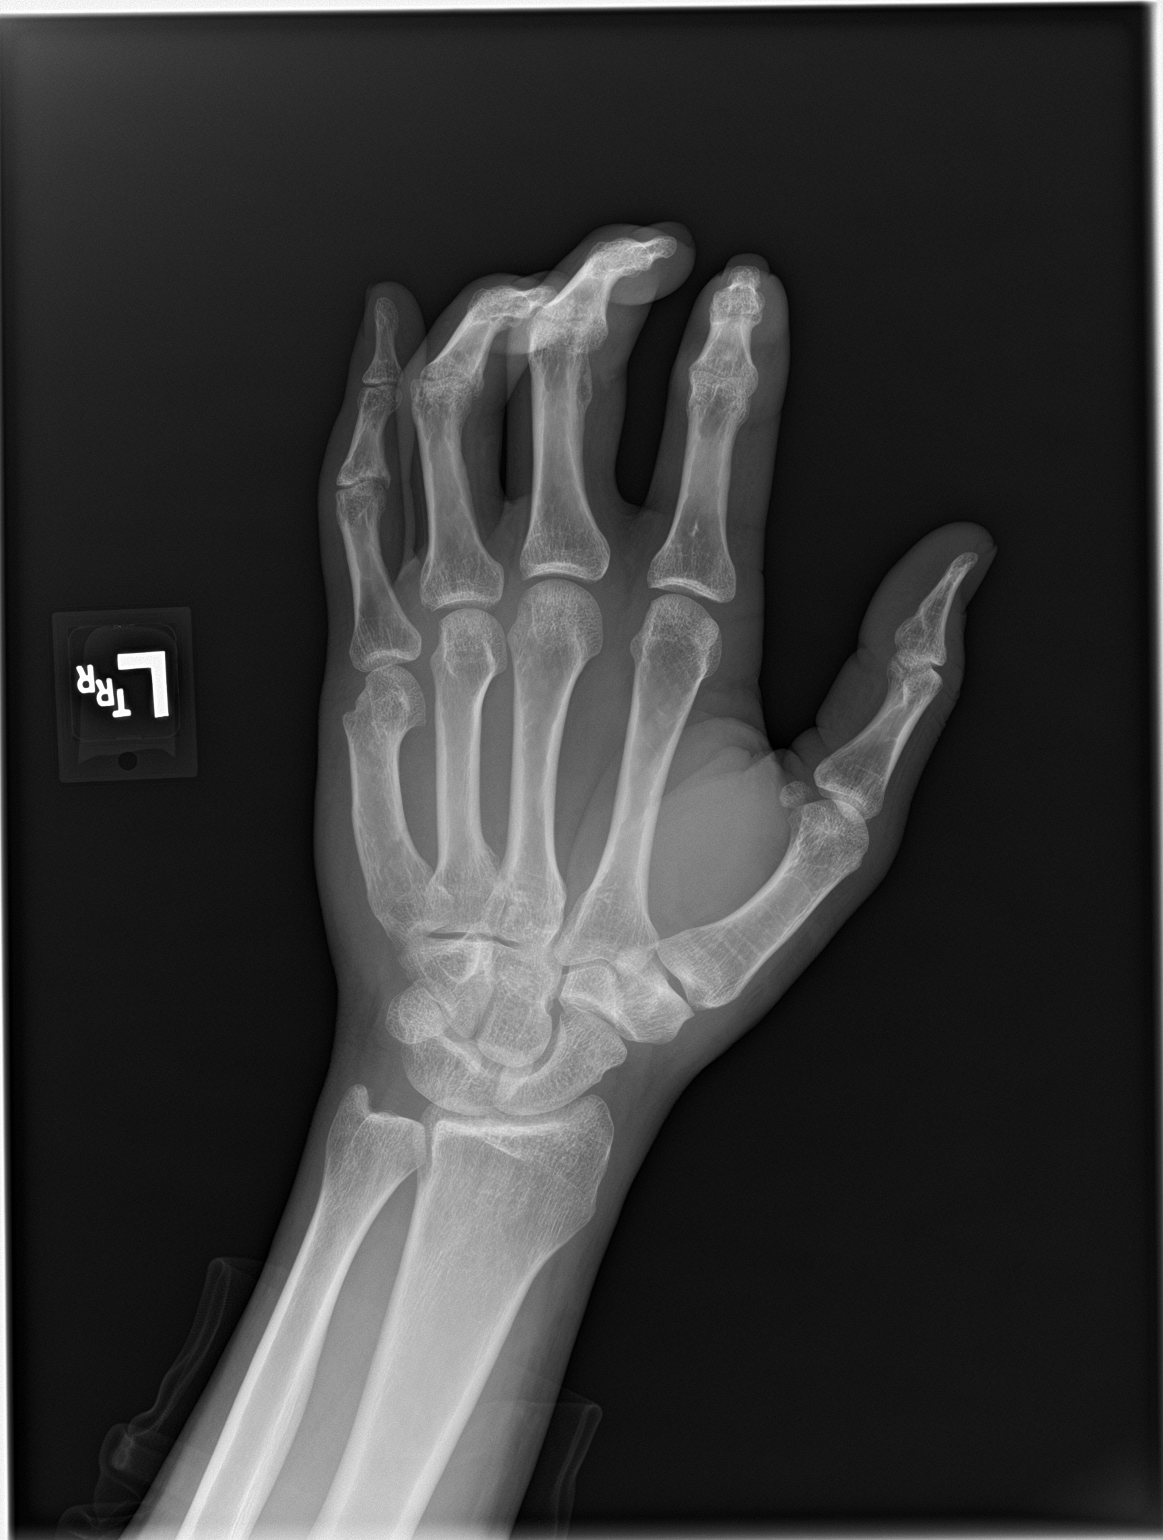
[im 2/3]
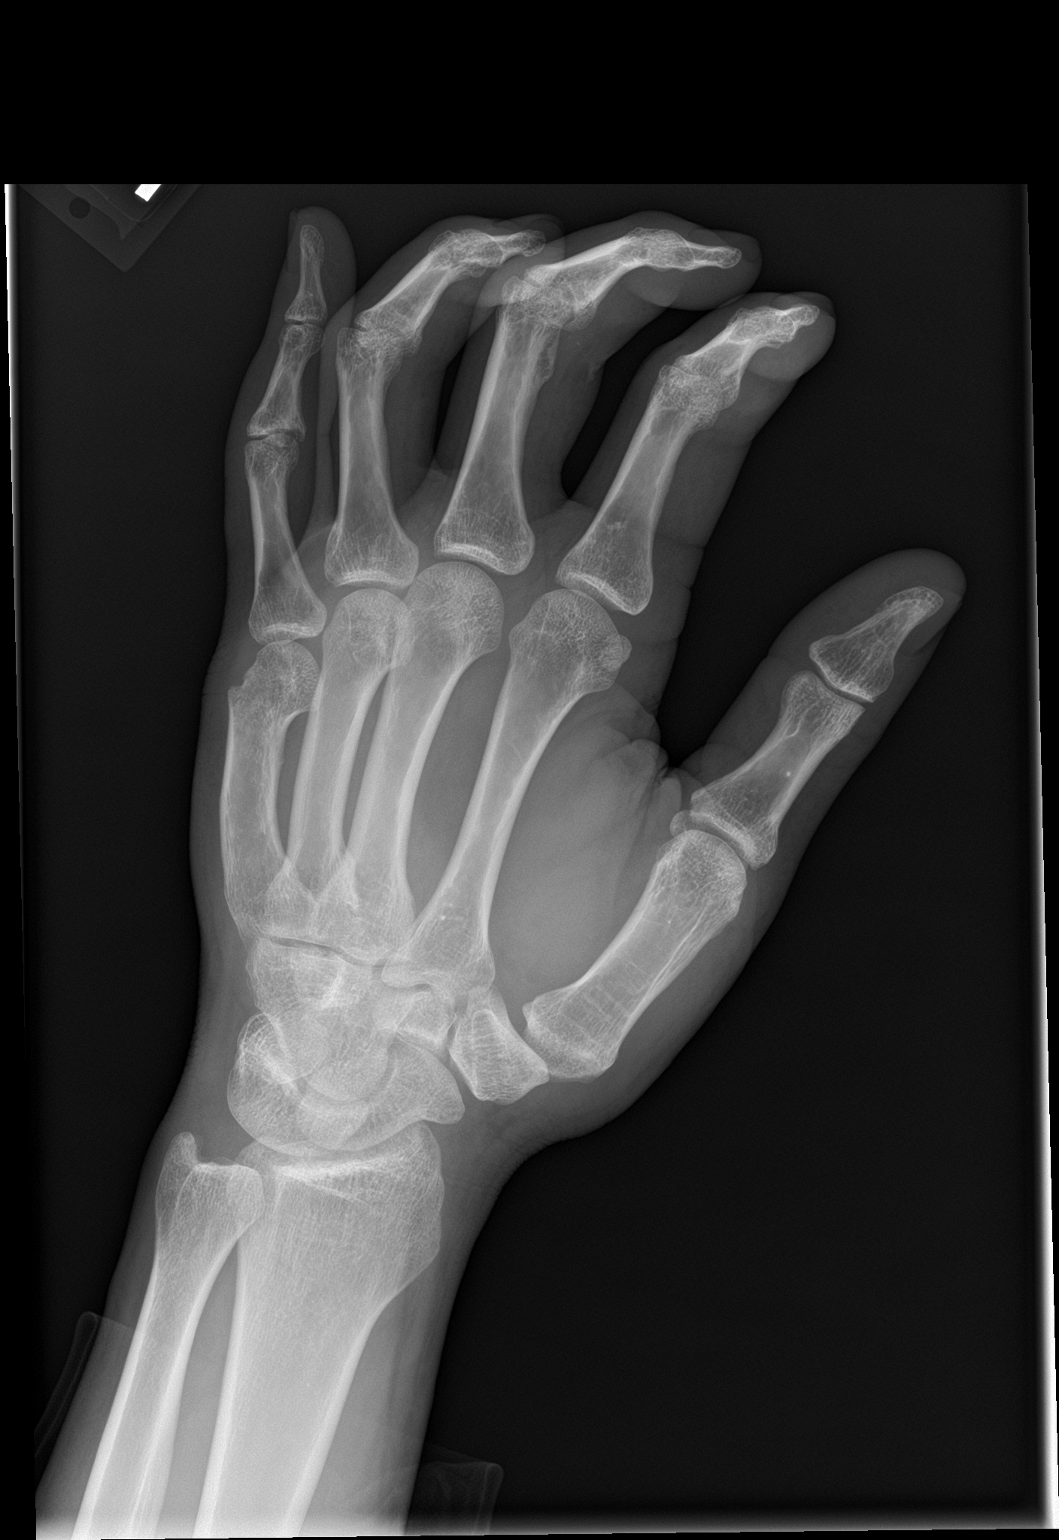
[im 3/3]
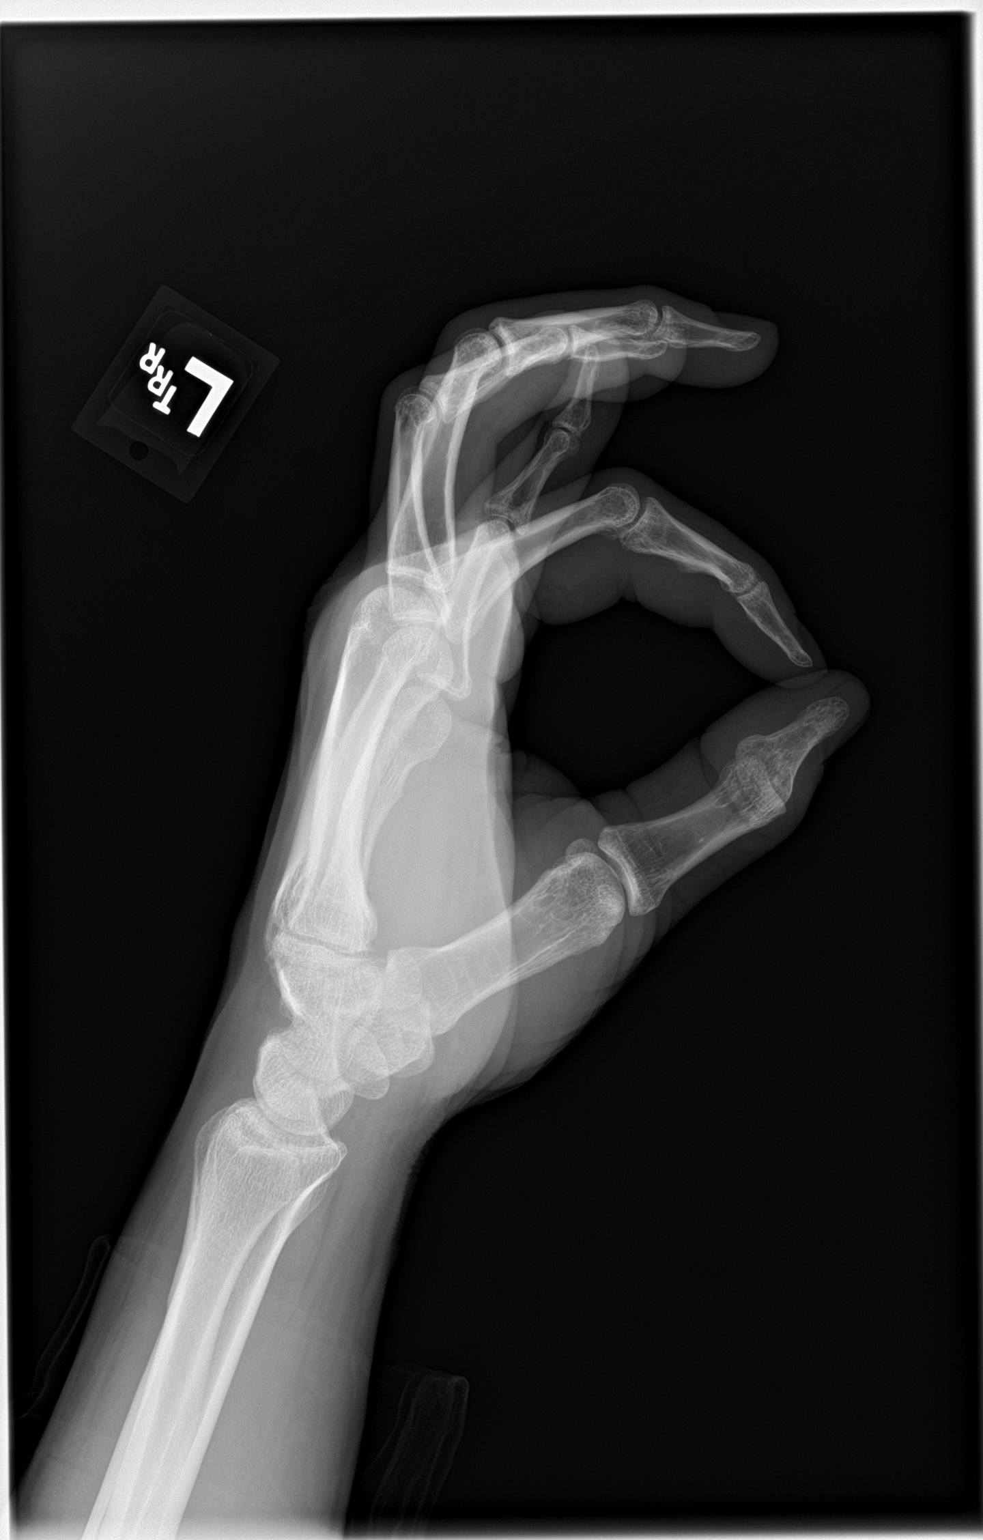

[3 of 3 positions shown; findings below may reference images not displayed]

FINDINGS: Limited by flexed positioning of the digits. Old fifth metacarpal
fracture. No acute displaced fracture or malalignment.
IMPRESSION: No acute osseous abnormality.  Old fifth metacarpal fracture

## 2021-04-17 ENCOUNTER — Emergency Department
Admission: EM | Admit: 2021-04-17 | Discharge: 2021-04-17 | Discharge status: Against Medical Advice | Payer: No Typology Code available for payment source

## 2021-04-17 NOTE — ED Notes (Signed)
No answer when called several times from room  

## 2021-04-18 ENCOUNTER — Other Ambulatory Visit: Payer: Self-pay

## 2021-04-18 ENCOUNTER — Emergency Department
Admission: EM | Admit: 2021-04-18 | Discharge: 2021-04-18 | Disposition: A | Discharge status: Home / Self Care | Payer: Self-pay | Attending: Emergency Medicine | Admitting: Emergency Medicine

## 2021-04-18 ENCOUNTER — Emergency Department: Payer: Self-pay

## 2021-04-18 DIAGNOSIS — W010XXA Fall on same level from slipping, tripping and stumbling without subsequent striking against object, initial encounter: Secondary | ICD-10-CM | POA: Insufficient documentation

## 2021-04-18 DIAGNOSIS — S93401A Sprain of unspecified ligament of right ankle, initial encounter: Secondary | ICD-10-CM | POA: Insufficient documentation

## 2021-04-18 DIAGNOSIS — F17210 Nicotine dependence, cigarettes, uncomplicated: Secondary | ICD-10-CM | POA: Insufficient documentation

## 2021-04-18 MED ORDER — HYDROCODONE-ACETAMINOPHEN 5-325 MG PO TABS
2.0000 | ORAL_TABLET | Freq: Once | ORAL | Status: AC
  Administered 2021-04-18: 2 via ORAL
  Filled 2021-04-18: qty 2

## 2021-04-18 NOTE — ED Provider Notes (Signed)
[facility] Emergency Department Provider Note  ____________________________________________   Event Date/Time   First MD Initiated Contact with Patient 04/18/21 1156     (approximate)  I have reviewed the triage vital signs and the nursing notes.   HISTORY  Chief Complaint Ankle Pain    HPI Linzy Darling [place] is a 35 y.o. male with past medical history as below here with right ankle pain.  The patient states that approximately 5 days ago, he was walking when he stepped down and believes he inverted his right ankle.  He has since had aching, throbbing, right ankle pain with swelling.  The pain is worse with any kind of weightbearing.  He has had persistent swelling that has mildly improved last 24 hours.  He has been trying to put ice on it when he remembers.  Is been taking over-the-counter meds without significant relief.  History of prior injuries to the ankle other than some mild sprains in high school.  No relieving factors.    Past Medical History:  Diagnosis Date   History of dog bite    02/ 2019   Seasonal allergies     There are no problems to display for this patient.   Past Surgical History:  Procedure Laterality Date   NERVE, TENDON AND ARTERY REPAIR Left 03/05/2017   Procedure: NERVE, TENDON AND ARTERY REPAIR INDEX LONG AND RING FINGER;  Surgeon: Dairl Ponder, MD;  Location: [facility];  Service: Orthopedics;  Laterality: Left;   SUTURE REMOVAL Left 01/07/2018   Procedure: REMOVAL OF LEFT INDEX, LONG AND RING FINGER RETAINED SUTURE WITH JOINT MANIPULATIONS;  Surgeon: Dairl Ponder, MD;  Location: [facility];  Service: Orthopedics;  Laterality: Left;    Prior to Admission medications   Medication Sig Start Date End Date Taking? Authorizing Provider  albuterol (VENTOLIN HFA) 108 (90 Base) MCG/ACT inhaler Inhale 2 puffs into the lungs every 6 (six) hours as needed for wheezing or shortness of breath.  01/30/21   Orvil Feil, PA-C  calcium carbonate (TUMS - DOSED IN MG ELEMENTAL CALCIUM) 500 MG chewable tablet Chew 1 tablet by mouth as needed for indigestion or heartburn.    [provider]  cyclobenzaprine (FLEXERIL) 10 MG tablet Take 1 tablet (10 mg total) by mouth 3 (three) times daily as needed. 10/16/19   Joni Reining, PA-C  dicyclomine (BENTYL) 10 MG capsule Take 1 capsule (10 mg total) by mouth 4 (four) times daily -  before meals and at bedtime for 7 days. 01/30/21 02/06/21  Orvil Feil, PA-C  ibuprofen (ADVIL) 600 MG tablet Take 1 tablet (600 mg total) by mouth every 8 (eight) hours as needed. 10/16/19   Joni Reining, PA-C  ibuprofen (ADVIL,MOTRIN) 200 MG tablet Take 200 mg by mouth every 6 (six) hours as needed.    [provider]  oxyCODONE-acetaminophen (PERCOCET) 7.5-325 MG tablet Take 1 tablet by mouth every 6 (six) hours as needed for severe pain. 10/16/19   Joni Reining, PA-C    Allergies Patient has no known allergies.  History reviewed. No pertinent family history.  Social History Social History   Tobacco Use   Smoking status: Every Day    Packs/day: 0.25    Years: 10.00    Pack years: 2.50    Types: Cigarettes   Smokeless tobacco: Never  Vaping Use   Vaping Use: Never used  Substance Use Topics   Alcohol use: Yes    Comment: occasionally  Drug use: No    Review of Systems  Review of Systems  Constitutional:  Negative for chills and fever.  HENT:  Negative for sore throat.   Respiratory:  Negative for shortness of breath.   Cardiovascular:  Negative for chest pain.  Gastrointestinal:  Negative for abdominal pain.  Genitourinary:  Negative for flank pain.  Musculoskeletal:  Positive for arthralgias and gait problem (Due to pain). Negative for neck pain.  Skin:  Negative for rash and wound.  Allergic/Immunologic: Negative for immunocompromised state.  Neurological:  Negative for weakness and numbness.  Hematological:  Does not  bruise/bleed easily.    ____________________________________________  PHYSICAL EXAM:      VITAL SIGNS: ED Triage Vitals [04/18/21 1124]  Enc Vitals Group     BP (!) 141/98     Pulse Rate 87     Resp 18     Temp 98.2 F (36.8 C)     Temp Source Oral     SpO2 100 %     Weight 218 lb (98.9 kg)     Height 6\' 3"  (1.905 m)     Head Circumference      Peak Flow      Pain Score 8     Pain Loc      Pain Edu?      Excl. in GC?      Physical Exam Vitals and nursing note reviewed.  Constitutional:      General: He is not in acute distress.    Appearance: He is well-developed.  HENT:     Head: Normocephalic and atraumatic.  Eyes:     Conjunctiva/sclera: Conjunctivae normal.  Cardiovascular:     Rate and Rhythm: Normal rate and regular rhythm.     Heart sounds: Normal heart sounds.  Pulmonary:     Effort: Pulmonary effort is normal. No respiratory distress.     Breath sounds: No wheezing.  Abdominal:     General: There is no distension.  Musculoskeletal:     Cervical back: Neck supple.  Skin:    General: Skin is warm.     Capillary Refill: Capillary refill takes less than 2 seconds.     Findings: No rash.  Neurological:     Mental Status: He is alert and oriented to person, place, and time.     Motor: No abnormal muscle tone.     LOWER EXTREMITY EXAM: RIGHT  INSPECTION & PALPATION: Moderate TTP over R lateral mal and medial mal, with medial>lateral swelling and ecchymoses along medial ankle and proximal foot.   SENSORY: sensation is intact to light touch in:  Superficial peroneal nerve distribution (over dorsum of foot) Deep peroneal nerve distribution (over first dorsal web space) Sural nerve distribution (over lateral aspect 5th metatarsal) Saphenous nerve distribution (over medial instep)  MOTOR:  + Motor EHL (great toe dorsiflexion) + FHL (great toe plantar flexion)  + TA (ankle dorsiflexion)  + GSC (ankle plantar flexion)  VASCULAR: 2+ dorsalis pedis  and posterior tibialis pulses Capillary refill < 2 sec, toes warm and well-perfused  COMPARTMENTS: Soft, warm, well-perfused No pain with passive extension No parethesias  ____________________________________________   LABS (all labs ordered are listed, but only abnormal results are displayed)  Labs Reviewed - No data to display  ____________________________________________  EKG:  ________________________________________  RADIOLOGY All imaging, including plain films, CT scans, and ultrasounds, independently reviewed by me, and interpretations confirmed via formal radiology reads.  ED MD interpretation:   DG ankle right: Soft tissue swelling,  old fracture noted but no acute abnormality  Official radiology report(s): DG Ankle Complete Right  Result Date: 04/18/2021 CLINICAL DATA:  Right ankle pain after fall EXAM: RIGHT ANKLE - COMPLETE 3+ VIEW COMPARISON:  None. FINDINGS: There is a well corticated round ossific fragment adjacent to the distal fibula which may reflect sequela of prior trauma. There is no evidence of acute fracture. Ankle alignment is maintained. The mortise is symmetrically intact. There is mild Achilles enthesopathy There is soft tissue swelling over the lateral malleolus. IMPRESSION: 1. Soft tissue swelling over the lateral malleolus without evidence of acute fracture or dislocation. 2. Small well corticated ossific fragment adjacent to the distal fibula may reflect sequela of prior trauma. Electronically Signed   By: Lesia Hausen M.D.   On: 04/18/2021 12:57    ____________________________________________  PROCEDURES   Procedure(s) performed (including Critical Care):  Procedures  ____________________________________________  INITIAL IMPRESSION / MDM / ASSESSMENT AND PLAN / ED COURSE  As part of my medical decision making, I reviewed the following data within the electronic MEDICAL RECORD NUMBER Nursing notes reviewed and incorporated, Old chart reviewed,  Notes from prior ED visits, and [place] Controlled Substance Database       *Theseus L [place] was evaluated in Emergency Department on 04/18/2021 for the symptoms described in the history of present illness. He was evaluated in the context of the global COVID-19 pandemic, which necessitated consideration that the patient might be at risk for infection with the SARS-CoV-2 virus that causes COVID-19. Institutional protocols and algorithms that pertain to the evaluation of patients at risk for COVID-19 are in a state of rapid change based on information released by regulatory bodies including the CDC and federal and state organizations. These policies and algorithms were followed during the patient's care in the ED.  Some ED evaluations and interventions may be delayed as a result of limited staffing during the pandemic.*     Medical Decision Making: 35 year old male here with right ankle pain after fall several days ago.  Patient has no open wounds.  Distal neuro vasculature is fully intact.  Compartments are soft.  Patient plain films reviewed, show old fracture but no acute abnormality.  Will treat as possible moderate ankle sprain with soft cast splint, weightbearing as tolerated with crutches for now, and outpatient orthopedic follow-up.  ____________________________________________  FINAL CLINICAL IMPRESSION(S) / ED DIAGNOSES  Final diagnoses:  Sprain of right ankle, unspecified ligament, initial encounter     MEDICATIONS GIVEN DURING THIS VISIT:  Medications  HYDROcodone-acetaminophen (NORCO/VICODIN) 5-325 MG per tablet 2 tablet (2 tablets Oral Given 04/18/21 1326)     ED Discharge Orders     None        Note:  This document was prepared using Dragon voice recognition software and may include unintentional dictation errors.   Shaune Pollack, MD 04/18/21 [phone]

## 2021-04-18 NOTE — ED Notes (Signed)
Flow up pcp/ortho info provided all questions answered

## 2021-04-18 NOTE — ED Triage Notes (Signed)
Pt comes pov with right ankle pain after tripping and falling, swelling present

## 2021-04-18 NOTE — Discharge Instructions (Signed)
Take Ibuprofen 600 mg every 6-8 hours for pain  Elevate the ankle to reduce swelling  Use the crutches as needed but start putting weigh on the ankle as able

## 2021-07-12 ENCOUNTER — Other Ambulatory Visit: Payer: Self-pay

## 2021-07-12 ENCOUNTER — Emergency Department
Admission: EM | Admit: 2021-07-12 | Discharge: 2021-07-12 | Disposition: A | Discharge status: Home / Self Care | Payer: Self-pay | Attending: Emergency Medicine | Admitting: Emergency Medicine

## 2021-07-12 ENCOUNTER — Encounter: Payer: Self-pay | Admitting: *Deleted

## 2021-07-12 DIAGNOSIS — S61210A Laceration without foreign body of right index finger without damage to nail, initial encounter: Secondary | ICD-10-CM | POA: Insufficient documentation

## 2021-07-12 DIAGNOSIS — W260XXA Contact with knife, initial encounter: Secondary | ICD-10-CM | POA: Insufficient documentation

## 2021-07-12 MED ORDER — KETOROLAC TROMETHAMINE 30 MG/ML IJ SOLN
30.0000 mg | Freq: Once | INTRAMUSCULAR | Status: AC
  Administered 2021-07-12: 30 mg via INTRAMUSCULAR
  Filled 2021-07-12: qty 1

## 2021-07-12 NOTE — ED Triage Notes (Signed)
Pt has avulsion to right index finger.  Pt cut self with a knife yesterday.  Bleeding controlled.  Pt alert.

## 2021-07-12 NOTE — ED Notes (Signed)
Pt verbalizes understanding of discharge instructions. Pt advised if symptoms worsen to return to ED.

## 2021-07-12 NOTE — ED Provider Notes (Signed)
[facility] Provider Note  Patient Contact: 10:03 PM (approximate)   History   Laceration   HPI  Harold Oneill is a 36 y.o. male presents to the emergency department with small superficial avulsion along the right index finger.  No numbness or tingling in the right hand.  Patient reports that injury occurred 24 hours ago.      Physical Exam   Triage Vital Signs: ED Triage Vitals [07/12/21 2040]  Enc Vitals Group     BP (!) 159/90     Pulse Rate 95     Resp 18     Temp 98.2 F (36.8 C)     Temp Source Oral     SpO2 95 %     Weight 230 lb (104.3 kg)     Height 6\' 3"  (1.905 m)     Head Circumference      Peak Flow      Pain Score 10     Pain Loc      Pain Edu?      Excl. in GC?     Most recent vital signs: Vitals:   07/12/21 2040  BP: (!) 159/90  Pulse: 95  Resp: 18  Temp: 98.2 F (36.8 C)  SpO2: 95%     General: Alert and in no acute distress. Eyes:  PERRL. EOMI. Head: No acute traumatic findings ENT:      Ears:       Nose: No congestion/rhinnorhea.      Mouth/Throat: Mucous membranes are moist. Neck: No stridor. No cervical spine tenderness to palpation. Cardiovascular:  Good peripheral perfusion Respiratory: Normal respiratory effort without tachypnea or retractions. Lungs CTAB. Good air entry to the bases with no decreased or absent breath sounds. Gastrointestinal: Bowel sounds 4 quadrants. Soft and nontender to palpation. No guarding or rigidity. No palpable masses. No distention. No CVA tenderness. Musculoskeletal: Full range of motion to all extremities.  Neurologic:  No gross focal neurologic deficits are appreciated.  Skin: Patient has superficial avulsion along the dorsal aspect of the right index finger. Other:   ED Results / Procedures / Treatments   Labs (all labs ordered are listed, but only abnormal results are displayed) Labs Reviewed - No data to display       PROCEDURES:  Critical Care  performed: No  Procedures   MEDICATIONS ORDERED IN ED: Medications  ketorolac (TORADOL) 30 MG/ML injection 30 mg (has no administration in time range)     IMPRESSION / MDM / ASSESSMENT AND PLAN / ED COURSE  I reviewed the triage vital signs and the nursing notes.                              Differential diagnosis includes, but is not limited to, avulsion laceration  Assessment and plan 36 year old male presents to the emergency department with a superficial avulsion along the distal aspect of the right index finger.  Patient was hypertensive at triage but vital signs were otherwise reassuring.  He was given an injection of Toradol for pain.  Wound was cleansed in the emergency department and a new dressing was applied.  Patient is up-to-date on his tetanus shot.      FINAL CLINICAL IMPRESSION(S) / ED DIAGNOSES   Final diagnoses:  Laceration of right index finger without foreign body without damage to nail, initial encounter     Rx / DC Orders   ED Discharge Orders  None        Note:  This document was prepared using Dragon voice recognition software and may include unintentional dictation errors.   Pia Mau [place], PA-C 07/12/21 2205    Gilles Chiquito, MD 07/13/21 1006

## 2022-08-28 IMAGING — DX DG ANKLE COMPLETE 3+V*R*
3 series · 3 of 3 positions shown · non-contrast
Comparison: None.

CLINICAL DATA: Right ankle pain after fall

EXAM:
RIGHT ANKLE - COMPLETE 3+ VIEW

[ankle ap]
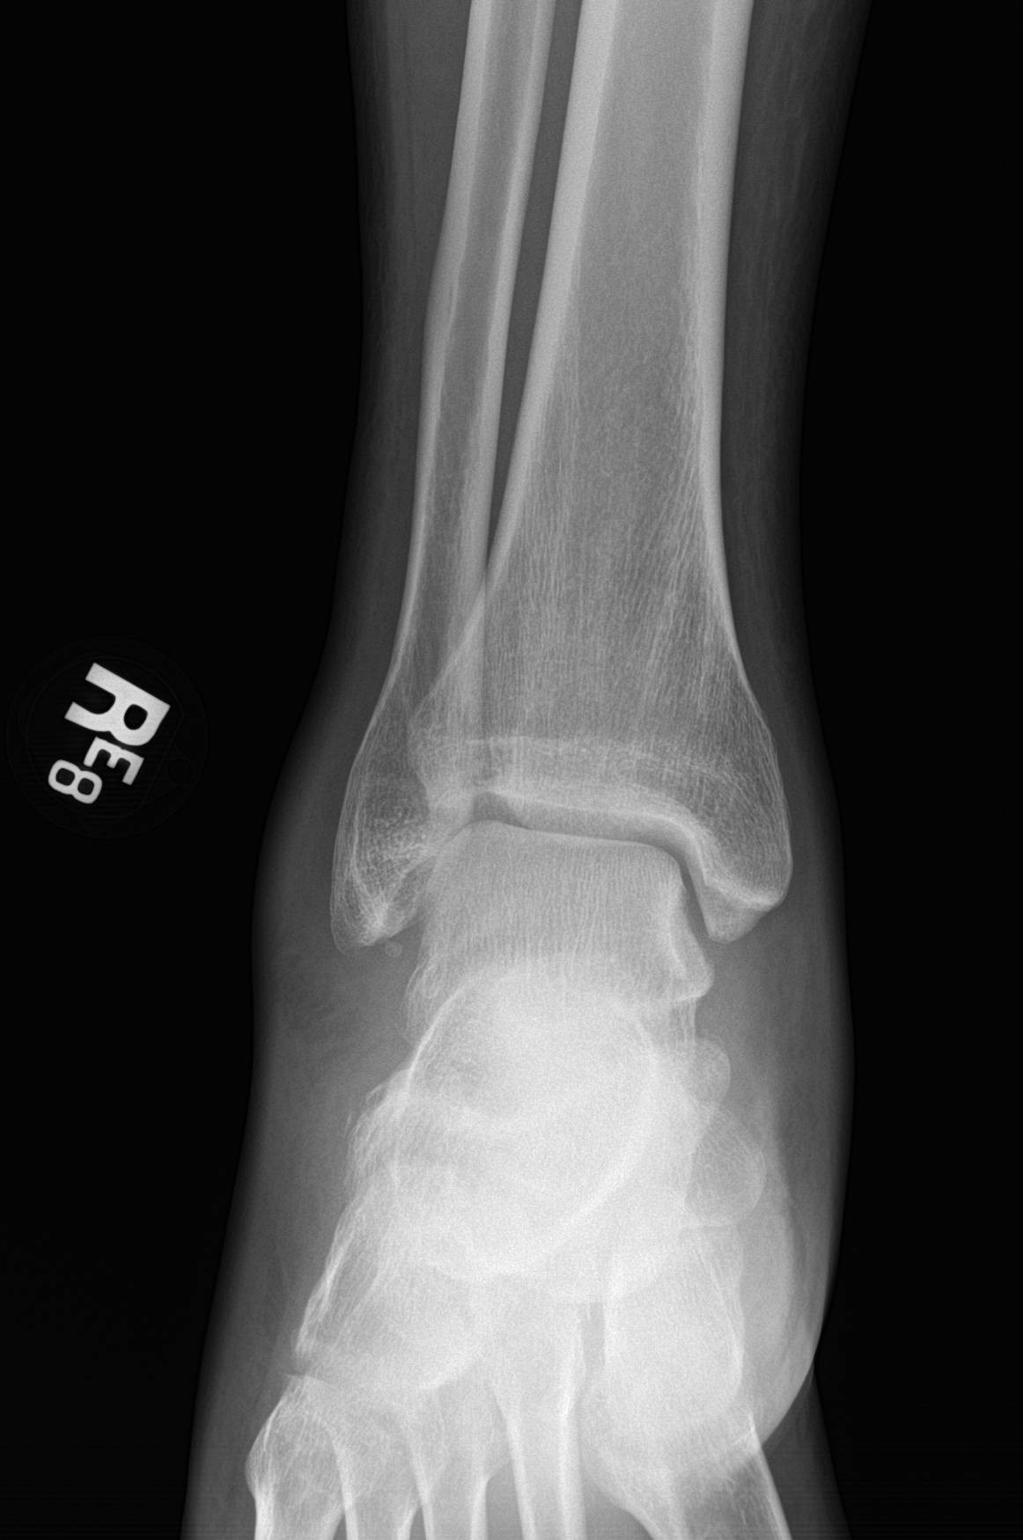

[ankle obl]
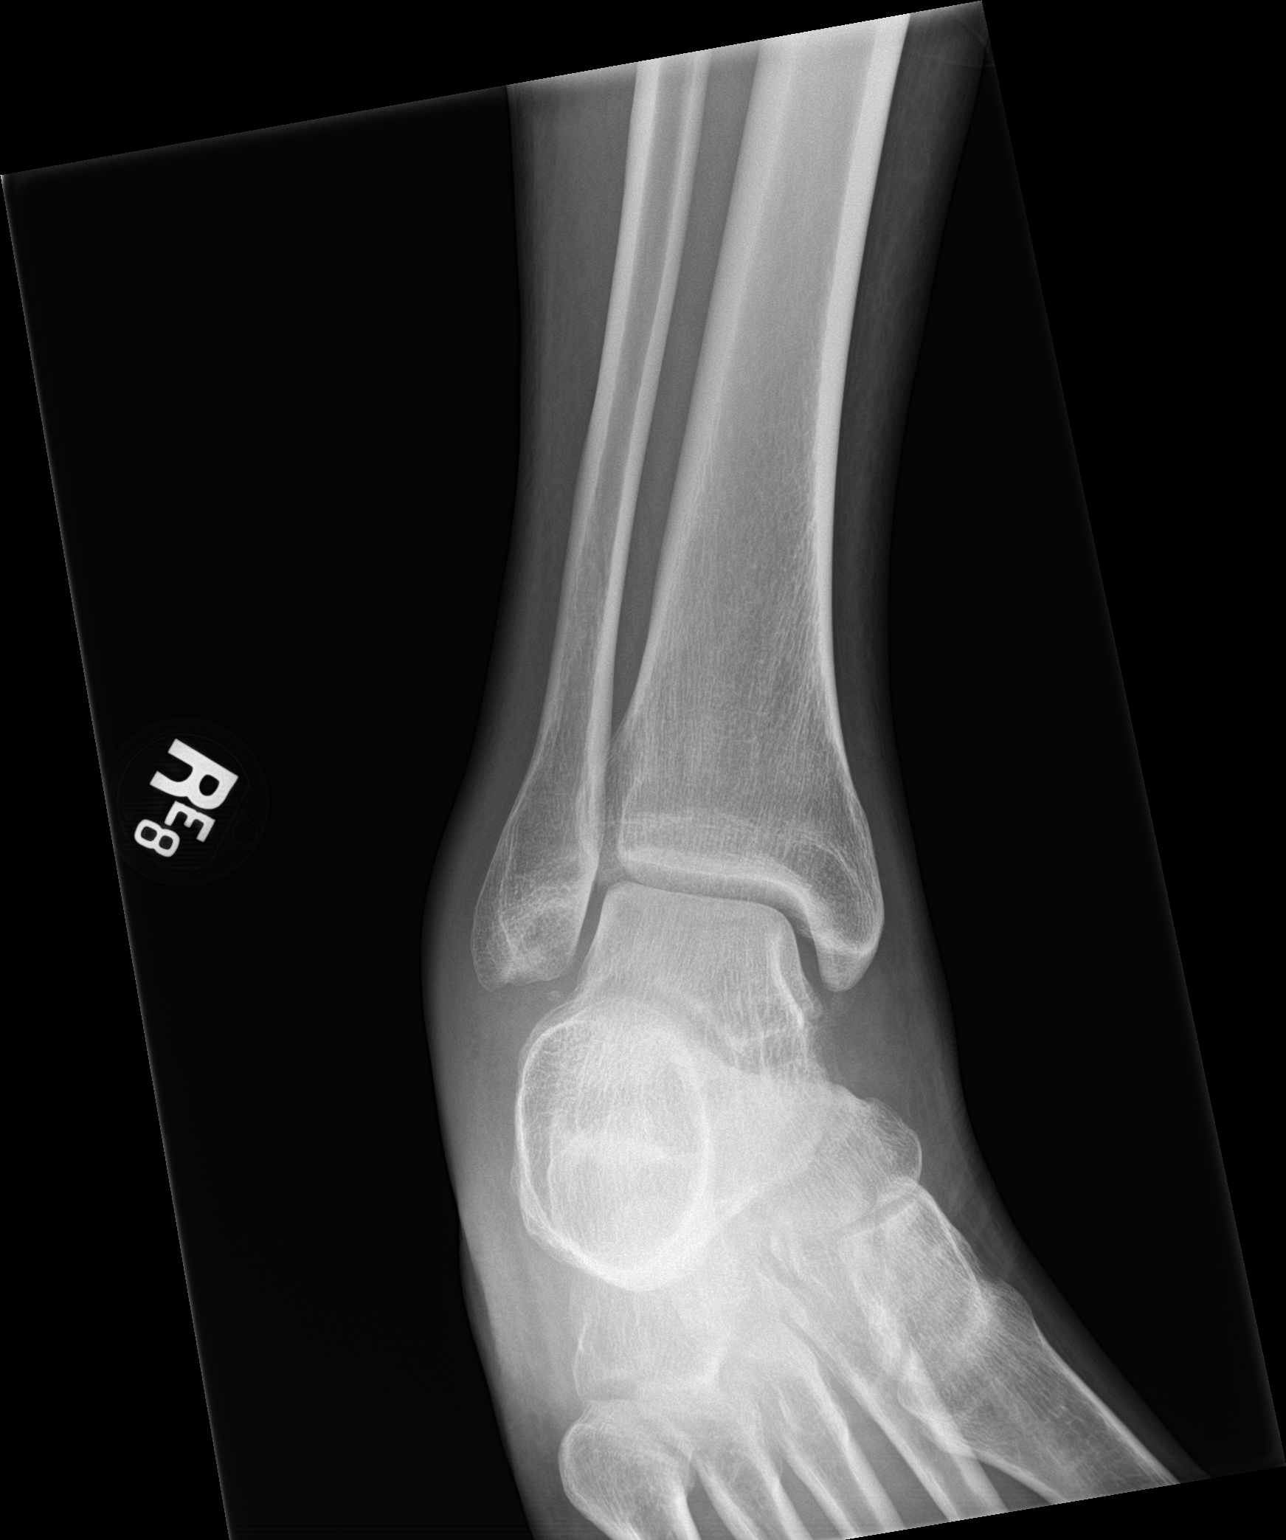

[ankle lat]
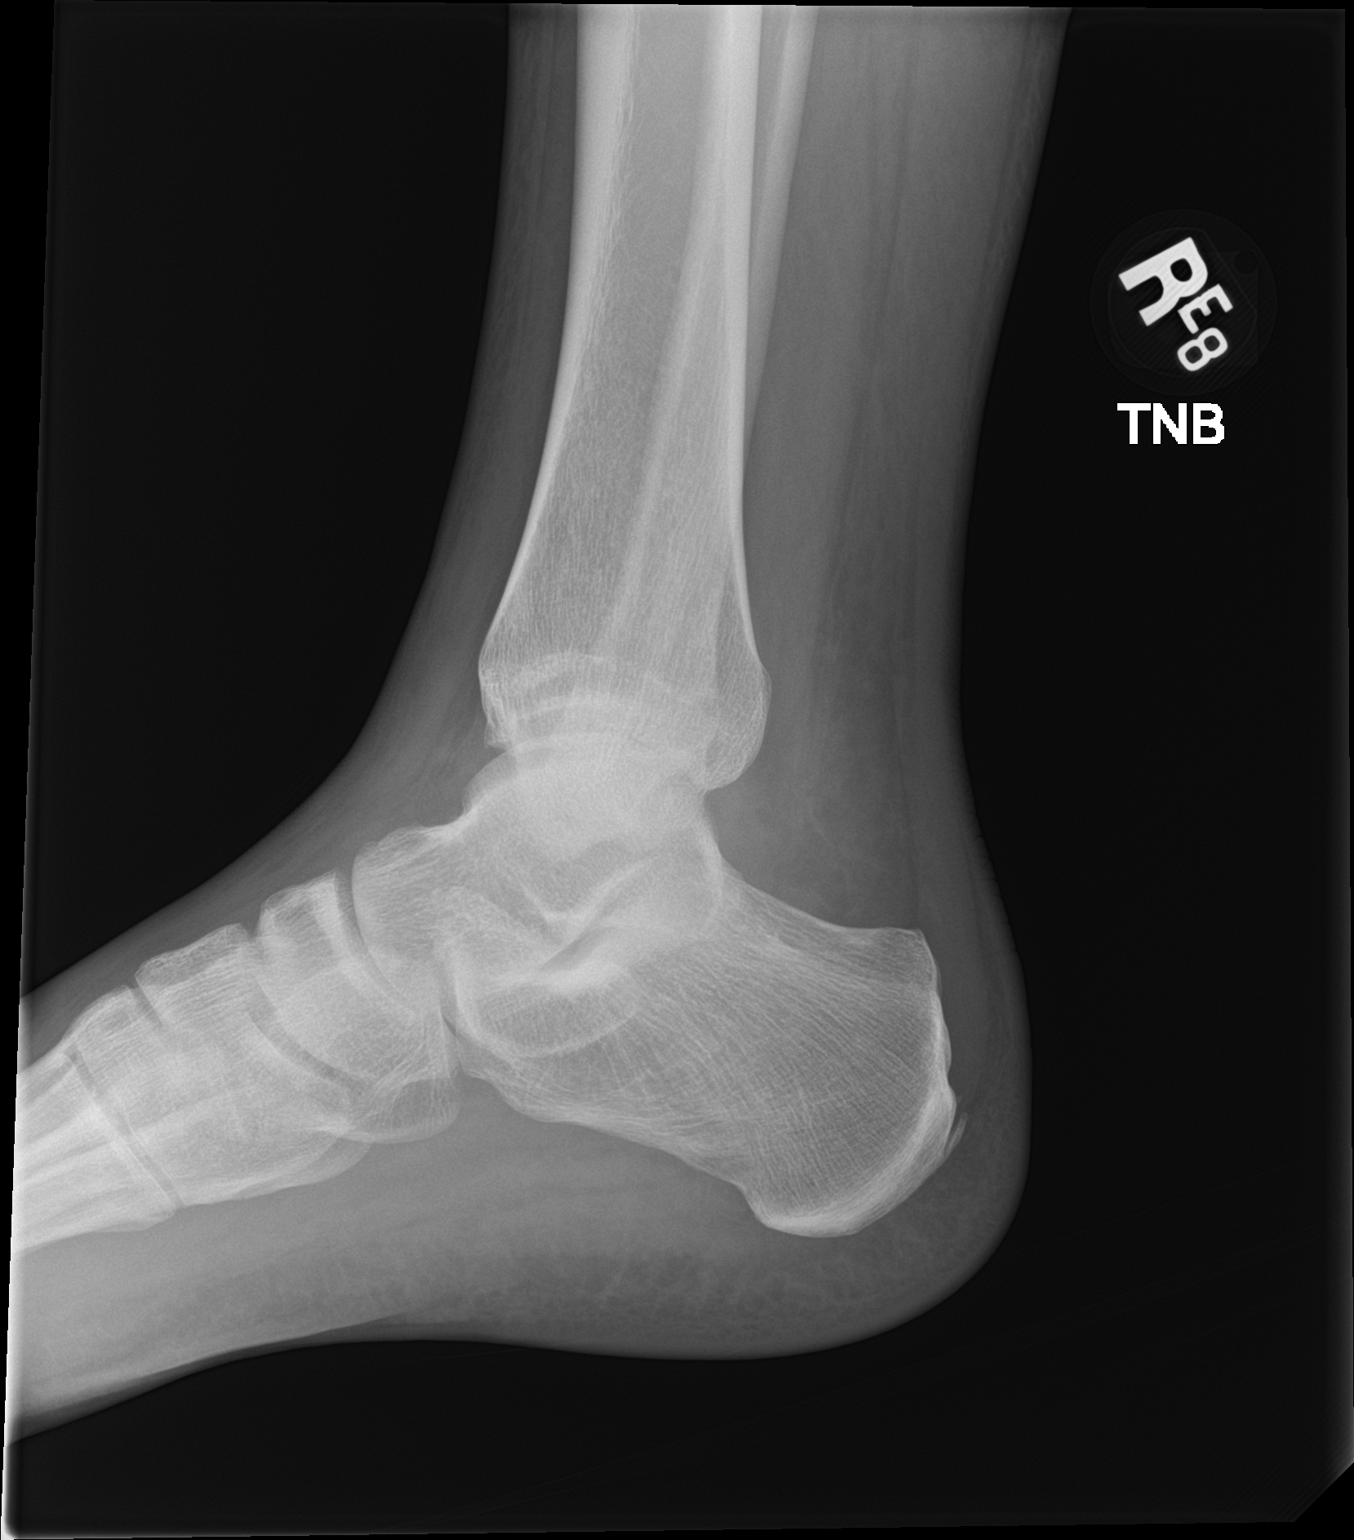

[3 of 3 positions shown; findings below may reference images not displayed]

FINDINGS: There is a well corticated round ossific fragment adjacent to the
distal fibula which may reflect sequela of prior trauma. There is no
evidence of acute fracture. Ankle alignment is maintained. The
mortise is symmetrically intact. There is mild Achilles enthesopathy

There is soft tissue swelling over the lateral malleolus.
IMPRESSION: 1. Soft tissue swelling over the lateral malleolus without evidence
of acute fracture or dislocation.
2. Small well corticated ossific fragment adjacent to the distal
fibula may reflect sequela of prior trauma.

## 2024-04-29 ENCOUNTER — Ambulatory Visit: Payer: Self-pay

## 2024-04-29 DIAGNOSIS — Z202 Contact with and (suspected) exposure to infections with a predominantly sexual mode of transmission: Secondary | ICD-10-CM

## 2024-04-29 DIAGNOSIS — Z113 Encounter for screening for infections with a predominantly sexual mode of transmission: Secondary | ICD-10-CM

## 2024-04-29 LAB — HM HIV SCREENING LAB: HM HIV Screening: NEGATIVE

## 2024-04-29 MED ORDER — METRONIDAZOLE 500 MG PO TABS: 2000.0000 mg | ORAL_TABLET | Freq: Once | ORAL | Status: AC

## 2024-04-29 NOTE — Progress Notes (Signed)
 [facility] Department STI clinic [address], [address] [place] [address] Main phone: [phone]  STI screening visit  Subjective:  Harold Oneill is a 38 y.o. male being seen today for an STI screening visit. The patient reports they do not have symptoms.    Patient has the following medical conditions:  There are no active problems to display for this patient.  Chief Complaint  Patient presents with   SEXUALLY TRANSMITTED DISEASE    Pt is here for STD screening, no symptoms and contact to Trichomoniasis   HPI Patient reports exposure to trichomonas. Desires STI screening. No symptoms.  Current tobacco smoker, not ready to quit but [employer] card provided.  See flowsheet for further details and programmatic requirements  Hyperlink available at the top of the signed note in blue.  Flow sheet content below:  Pregnancy Intention Screening Does the patient want to become pregnant in the next year?: N/A Does the patient's partner want to become pregnant in the next year?: Unsure Would the patient like to discuss contraceptive options today?: N/A Reason For STD Screen STD Screening: Is a contact Have you ever had an STD?: Yes History of Antibiotic use in the past 2 weeks?: No STD Symptoms Denies all: Yes Risk Factors for Hep B Household, sexual, or needle sharing contact of a person infected with Hep B: No Sexual contact with a person who uses drugs not as prescribed?: No Currently or Ever used drugs not as prescribed: No HIV Positive: No PRep Patient: No Men who have sex with men: No Have Hepatitis C: No History of Incarceration: No History of Homeslessness?: No Anal sex following anal drug use?: No Risk Factors for Hep C Currently using drugs not as prescribed: No Sexual partner(s) currently using drugs as not prescribed: No History of drug use: No HIV Positive: No People with a history of incarceration: No People born between the  years of 40 and 43: No Advise Advised client to quit or stay quit. : Yes Counseling Patient counseled to abstain from sex for: 10 days Patient counseled to abstain from sex for?: 7 days post partner's treatment Patient counseled to avoid alcohol for?: 10 days Patient counseled to use condoms with all sex: Condoms given RTC in 2-3 weeks for test results: Yes Clinic will call if test results abnormal before test result appt.: Yes Patient should return to the clinic for re-treatment if vomits within 2 hours after taking meds   : Yes Immunizations: Referred Test results given to patient Patient counseled to use condoms with all sex: Condoms given STD Treatment Patient in to clinic for treatment of: Trichomoniasis Patient counseled to abstain from sex for: 10 days Patient counseled to abstain from sex for?: 7 days post partner's treatment Patient counseled to avoid alcohol for?: 10 days Nursing Actions: Patient treated per S.O., Patient should RTC for re-treatment if vomits within 2 hours after taking meds., Patient counseled to use condoms with all sex - Condoms given  Screening for MPX risk:  Unexplained rash?  No   MSM?  No   Multiple or anonymous sex partners?  No   Any close or sexual contact with a person  diagnosed with MPX?  No   Any outside the [place]  where MPX is endemic?  No   High clinical suspicion for MPX?    -Unlikely to be chickenpox    -Lymphadenopathy    -Rash that presents in same phase of       evolution  on any given body part  No   STI screening history: Last HIV test per patient/review of record was No results found for: HMHIVSCREEN No results found for: HIV  Last HEPC test per patient/review of record was No results found for: HMHEPCSCREEN No components found for: HEPC   Last HEPB test per patient/review of record was No components found for: HMHEPBSCREEN   Fertility: Does the patient or their partner desires a pregnancy in the next year?  No  Immunization History  Administered Date(s) Administered   Tdap 03/22/2017    The following portions of the patient's history were reviewed and updated as appropriate: allergies, current medications, past medical history, past social history, past surgical history and problem list.  Objective:  There were no vitals filed for this visit.  Physical Exam Vitals and nursing note reviewed.  Constitutional:      Appearance: Normal appearance.  HENT:     Head: Normocephalic and atraumatic.     Mouth/Throat:     Mouth: Mucous membranes are moist.     Pharynx: No oropharyngeal exudate or posterior oropharyngeal erythema.  Eyes:     General:        Right eye: No discharge.        Left eye: No discharge.     Conjunctiva/sclera:     Right eye: Right conjunctiva is not injected. No exudate.    Left eye: Left conjunctiva is not injected. No exudate. Pulmonary:     Effort: Pulmonary effort is normal.  Abdominal:     General: Abdomen is flat.  Genitourinary:    Comments: Declined genital exam- asymptomatic Lymphadenopathy:     Cervical: No cervical adenopathy.     Upper Body:     Right upper body: No supraclavicular or axillary adenopathy.     Left upper body: No supraclavicular or axillary adenopathy.  Skin:    General: Skin is warm and dry.  Neurological:     Mental Status: He is alert and oriented to person, place, and time.    Assessment and Plan:  Harold Oneill is a 38 y.o. male presenting to the [facility] Department for STI screening  1. Trichomonas contact (Primary)  - metroNIDAZOLE (FLAGYL) 500 MG tablet; Take 4 tablets (2,000 mg total) by mouth once for 1 dose.  2. Screening for venereal disease  - HIV [place] [facility] - Syphilis Serology, [place] [facility] - Gonococcus culture - Chlamydia/GC NAA, Confirmation  Patient does not have STI symptoms Patient accepted the following screenings: oral GC culture, urine CT/GC, HIV, and RPR Patient meets criteria  for HepB screening? No. Ordered? no Patient meets criteria for HepC screening? No. Ordered? no Recommended condom use with all sex Discussed importance of condom use for STI prevention  Treat positive test results per standing order. Discussed time line for [facility] results and that patient will be called with positive results and encouraged patient to call if he had not heard in 2 weeks Recommended repeat testing in 3 months with positive results. Recommended returning for continued or worsening symptoms.   Return in about 3 months (around 07/30/2024).  No future appointments.  Damien FORBES Satchel, NP

## 2024-04-29 NOTE — Progress Notes (Signed)
 Pt is here STD Screening and an contact to Trichomoniasis. The patient was dispensed Metronidazole 2000 mg tablets once orally. I provided counseling today regarding the medication, the side effects and when to call clinic. Patient was given the opportunity to ask questions for any clarifications. Questions answered. Brochure and condoms given. Wilkie Drought, RN.

## 2024-05-02 LAB — CHLAMYDIA/GC NAA, CONFIRMATION
Chlamydia trachomatis, NAA: NEGATIVE
Neisseria gonorrhoeae, NAA: NEGATIVE

## 2024-05-04 LAB — GONOCOCCUS CULTURE

## 2024-06-10 ENCOUNTER — Ambulatory Visit: Payer: Self-pay

## 2024-07-01 ENCOUNTER — Ambulatory Visit: Payer: Self-pay

## 2024-07-01 DIAGNOSIS — Z113 Encounter for screening for infections with a predominantly sexual mode of transmission: Secondary | ICD-10-CM

## 2024-07-01 DIAGNOSIS — Z202 Contact with and (suspected) exposure to infections with a predominantly sexual mode of transmission: Secondary | ICD-10-CM

## 2024-07-01 LAB — HM HIV SCREENING LAB: HM HIV Screening: NEGATIVE

## 2024-07-01 MED ORDER — METRONIDAZOLE 500 MG PO TABS: 2000.0000 mg | ORAL_TABLET | Freq: Once | ORAL | Status: AC

## 2024-07-01 NOTE — Progress Notes (Signed)
 " [facility] Department STI clinic [address], [address] [place] [address] Main phone: [phone]  STI screening visit  Subjective:  Harold Oneill is a 39 y.o. male being seen today for an STI screening visit. The patient reports they do have symptoms.    Patient has the following medical conditions:  There are no active problems to display for this patient.  Chief Complaint  Patient presents with   SEXUALLY TRANSMITTED DISEASE    STD screening/has symptoms   HPI Patient reports urine is bubbly. He denies urine odor, cloudiness, but states he sees bubbles in his urine. No other symptoms today. He denies genital itching, burning, dysuria. He reports when treated as a trichomonas contact, he did not take the pills all at once as directed. He took one, waited a while, took another at some point, unsure because it was three months ago.  Discussed re-treating today as we cannot test him to make sure he does not have trichomonas. He agrees. Discussed taking pills all at once, eating first so it does not impact his stomach as much.  Reproductive considerations: Does the patient or their partner desires a pregnancy in the next year? No  See flowsheet for further details and programmatic requirements  Hyperlink available at the top of the signed note in blue.  Flow sheet content below:  Pregnancy Intention Screening Does the patient want to become pregnant in the next year?: N/A Does the patient's partner want to become pregnant in the next year?: No Would the patient like to discuss contraceptive options today?: N/A Reason For STD Screen STD Screening: Has symptoms Have you ever had an STD?: Yes History of Antibiotic use in the past 2 weeks?: No STD Symptoms Denies all: No Genital Itching: No Lower abdominal pain: No Discharge: No Dysuria: No Genital ulcer / lesion: No Rash: No Oral / Other skin ulcer: No Pain with sex: No Sore Throat:  No Visual Changes: No Risk Factors for Hep B Household, sexual, or needle sharing contact of a person infected with Hep B: No Sexual contact with a person who uses drugs not as prescribed?: No Currently or Ever used drugs not as prescribed: No HIV Positive: No PRep Patient: No Men who have sex with men: No Have Hepatitis C: No History of Incarceration: No History of Homeslessness?: No Anal sex following anal drug use?: No Risk Factors for Hep C Currently using drugs not as prescribed: No Sexual partner(s) currently using drugs as not prescribed: No History of drug use: No HIV Positive: No People with a history of incarceration: No People born between the years of 78 and 40: No Advise Advised client to quit or stay quit. : Yes Counseling Patient counseled to use condoms with all sex: Condoms given Test results given to patient Patient counseled to use condoms with all sex: Condoms given  Screening for MPX risk:  Unexplained rash?  No   MSM?  No   Multiple or anonymous sex partners?  No   Any close or sexual contact with a person  diagnosed with MPX?  No   Any outside the [place]  where MPX is endemic?  No   High clinical suspicion for MPX?    -Unlikely to be chickenpox    -Lymphadenopathy    -Rash that presents in same phase of       evolution on any given body part  No   Does this patient meet CDC recommendations for vaccination against MPOX? No  You already have or anticipate having the following risks:  Your sex partner has the following risks: You're traveling to a county with a clade I MPOX outbreak and anticipate these risks: Occupational exposure  You had known or suspected exposure to someone with monkeypox You had a sex partner in the past 2 weeks who was diagnosed with monkeypox You are a gay, bisexual, or other man who has sex with men, or are transgender or nonbinary and in the past 6 months have had any of the following: - A new diagnosis of one or more  sexually transmitted diseases (e.g., chlamydia, gonorrhea, or syphilis) - More than one sex partner You have had any of the following in the past 6 months: - Sex at a commercial sex venue (like a sex club or bathhouse) - Sex related to a large commercial event   or in a geographic area (city or county for example) where mpox virus transmission is occurring Sex with a new partner Sex at a commercial sex venue (e.g., a sex club or bathhouse) Sex in it consultant for money, goods, drugs, or other trade Sex in association with a large public event (e.g., a rave, party, or festival) i.e. certain people who work in a laboratory or healthcare facility   Infectious disease screenings: Vaccinated against HPV? Unknown  HIV Ever had a positive? Yes Last test: 2025 Results in chart:  Lab Results  Component Value Date   HMHIVSCREEN Negative - Validated 04/29/2024   No results found for: HIV   Hep B Hep B status: unknown or no prior testing Received HBV vaccination? Unknown Received HBV testing for immunity? No Results in chart:  No components found for: HMHEPBSCREEN  Do they qualify for HBV screening today? No Criteria:  -Household, sexual or needle sharing contact with HBV -History of drug use or homelessness -HIV positive -Those with known Hep C  Hep C Hep C status: unknown or no prior testing Results in chart:  No results found for: HMHEPCSCREEN No components found for: HEPC  Do they qualify for HCV screening today? No Criteria - since the last HCV result, does the patient have any of the following? - Current drug use - Have a partner with drug use - Has been incarcerated  Immunization history:  Immunization History  Administered Date(s) Administered   Tdap 03/22/2017    The following portions of the patient's history were reviewed and updated as appropriate: allergies, current medications, past medical history, past social history, past surgical history and problem  list.  Substance use screenings:  Uses tobacco products? Yes Uses vapes? Yes Uses alcohol? Yes Uses non-injectable substances that alter your mental status? No Uses non-prescribed injectable substances? No  Immunization History  Administered Date(s) Administered   Tdap 03/22/2017    The following portions of the patient's history were reviewed and updated as appropriate: allergies, current medications, past medical history, past social history, past surgical history and problem list.  Objective:  There were no vitals filed for this visit.  Physical Exam Vitals and nursing note reviewed.  Constitutional:      Appearance: Normal appearance.  HENT:     Head: Normocephalic and atraumatic.     Comments: No nits or hair loss of scalp, brows, and lashes    Mouth/Throat:     Mouth: Mucous membranes are moist.     Pharynx: Oropharynx is clear. No oropharyngeal exudate or posterior oropharyngeal erythema.  Eyes:     General:        Right  eye: No discharge.        Left eye: No discharge.     Conjunctiva/sclera: Conjunctivae normal.     Right eye: Right conjunctiva is not injected. No exudate.    Left eye: Left conjunctiva is not injected. No exudate. Pulmonary:     Effort: Pulmonary effort is normal.  Abdominal:     General: Abdomen is flat.  Genitourinary:    Comments: Politely declined genital exam. Lymphadenopathy:     Cervical: No cervical adenopathy.     Upper Body:     Right upper body: No supraclavicular adenopathy.     Left upper body: No supraclavicular adenopathy.     Comments: Patient declines genital exam. Inguinal lymph nodes not assessed.   Skin:    General: Skin is warm and dry.     Findings: No lesion or rash.  Neurological:     Mental Status: He is alert and oriented to person, place, and time.    Assessment and Plan:  Harold Oneill is a 39 y.o. male presenting to the [facility] Department for STI screening.  Patient accepted the following  screenings: oral GC culture, urine CT/GC, HIV, and RPR  1. Trichomonas contact (Primary)  - Re-treating due to not taking medication as directed when here on 04/29/24 - metroNIDAZOLE  (FLAGYL ) 500 MG tablet; Take 4 tablets (2,000 mg total) by mouth once for 1 dose.  2. Screening for venereal disease  - Chlamydia/GC NAA, Confirmation - HIV [facility] - Gonococcus culture - Syphilis Serology, [facility]   Counseling: Recommended condom use with all sex/condoms provided Discussed importance of condom use for STI prevention Discussed time line for [facility] results and that patient will be called with positive results and encouraged patient to call if they had not heard in 2 weeks Recommended repeat testing in 3 months with positive results. Recommended returning for continued or worsening symptoms.   Return in about 3 months (around 09/29/2024).  No future appointments.  Damien FORBES Satchel, NP "

## 2024-07-01 NOTE — Progress Notes (Signed)
 Pt is here for STD screening. Patient was dispensed metronidazole  tablets 2000 once orally.Pt has been counseled about the medication. Condoms given. Kwadwo Seith Aikey,RN.

## 2024-07-04 LAB — CHLAMYDIA/GC NAA, CONFIRMATION
Chlamydia trachomatis, NAA: NEGATIVE
Neisseria gonorrhoeae, NAA: NEGATIVE

## 2024-07-05 LAB — GONOCOCCUS CULTURE
